# Patient Record
Sex: Male | Born: 1945 | Race: White | Hispanic: No | Marital: Married | Smoking: Never smoker
Health system: Southern US, Community
[De-identification: ages and names within clinical notes are randomized; demographics above are authoritative.]

## PROBLEM LIST (undated history)

## (undated) DIAGNOSIS — J449 Chronic obstructive pulmonary disease, unspecified: Secondary | ICD-10-CM

## (undated) DIAGNOSIS — I209 Angina pectoris, unspecified: Secondary | ICD-10-CM

## (undated) DIAGNOSIS — N400 Enlarged prostate without lower urinary tract symptoms: Secondary | ICD-10-CM

## (undated) DIAGNOSIS — K219 Gastro-esophageal reflux disease without esophagitis: Secondary | ICD-10-CM

## (undated) DIAGNOSIS — G4730 Sleep apnea, unspecified: Secondary | ICD-10-CM

## (undated) DIAGNOSIS — J45909 Unspecified asthma, uncomplicated: Secondary | ICD-10-CM

## (undated) DIAGNOSIS — I2510 Atherosclerotic heart disease of native coronary artery without angina pectoris: Secondary | ICD-10-CM

## (undated) DIAGNOSIS — E785 Hyperlipidemia, unspecified: Secondary | ICD-10-CM

## (undated) DIAGNOSIS — E119 Type 2 diabetes mellitus without complications: Secondary | ICD-10-CM

## (undated) DIAGNOSIS — I10 Essential (primary) hypertension: Secondary | ICD-10-CM

## (undated) HISTORY — PX: CORONARY ARTERY BYPASS GRAFT: SHX141

## (undated) HISTORY — PX: OTHER SURGICAL HISTORY: SHX169

## (undated) HISTORY — PX: CATARACT EXTRACTION W/ INTRAOCULAR LENS  IMPLANT, BILATERAL: SHX1307

## (undated) HISTORY — PX: HERNIA REPAIR: SHX51

## (undated) HISTORY — DX: Hyperlipidemia, unspecified: E78.5

## (undated) SURGERY — CARDIOVERSION (CATH LAB)
Anesthesia: IV Sedation (MBSC Only)

## (undated) SURGERY — LEFT HEART CATH
Anesthesia: Moderate Sedation | Laterality: Left

---

## 1980-05-26 HISTORY — PX: HERNIA REPAIR: SHX51

## 2003-10-19 ENCOUNTER — Other Ambulatory Visit: Payer: Self-pay

## 2004-08-26 ENCOUNTER — Ambulatory Visit: Payer: Self-pay | Admitting: Cardiovascular Disease

## 2004-10-01 ENCOUNTER — Encounter: Payer: Self-pay | Admitting: Cardiovascular Disease

## 2004-10-24 ENCOUNTER — Encounter: Payer: Self-pay | Admitting: Cardiovascular Disease

## 2004-11-23 ENCOUNTER — Encounter: Payer: Self-pay | Admitting: Cardiovascular Disease

## 2004-12-24 ENCOUNTER — Encounter: Payer: Self-pay | Admitting: Cardiovascular Disease

## 2005-01-24 ENCOUNTER — Encounter: Payer: Self-pay | Admitting: Cardiovascular Disease

## 2006-12-07 ENCOUNTER — Ambulatory Visit: Payer: Self-pay | Admitting: Endocrinology

## 2006-12-21 ENCOUNTER — Ambulatory Visit: Payer: Self-pay | Admitting: Endocrinology

## 2007-04-14 ENCOUNTER — Inpatient Hospital Stay: Payer: Self-pay | Admitting: Endocrinology

## 2007-04-14 ENCOUNTER — Other Ambulatory Visit: Payer: Self-pay

## 2008-05-08 ENCOUNTER — Encounter: Admission: RE | Admit: 2008-05-08 | Discharge: 2008-05-08 | Payer: Self-pay | Admitting: Sports Medicine

## 2008-09-12 ENCOUNTER — Ambulatory Visit: Payer: Self-pay | Admitting: Cardiovascular Disease

## 2009-01-18 ENCOUNTER — Encounter: Payer: Self-pay | Admitting: Cardiovascular Disease

## 2009-01-24 ENCOUNTER — Encounter: Payer: Self-pay | Admitting: Cardiovascular Disease

## 2009-02-08 ENCOUNTER — Emergency Department: Payer: Self-pay | Admitting: Emergency Medicine

## 2009-02-15 ENCOUNTER — Ambulatory Visit: Payer: Self-pay | Admitting: Internal Medicine

## 2009-02-23 ENCOUNTER — Encounter: Payer: Self-pay | Admitting: Cardiovascular Disease

## 2009-03-07 ENCOUNTER — Ambulatory Visit: Payer: Self-pay | Admitting: Gastroenterology

## 2009-03-26 ENCOUNTER — Encounter: Payer: Self-pay | Admitting: Cardiovascular Disease

## 2009-07-17 ENCOUNTER — Ambulatory Visit: Payer: Self-pay | Admitting: Internal Medicine

## 2009-07-19 DIAGNOSIS — E119 Type 2 diabetes mellitus without complications: Secondary | ICD-10-CM | POA: Insufficient documentation

## 2009-08-13 ENCOUNTER — Ambulatory Visit: Payer: Self-pay | Admitting: Internal Medicine

## 2009-10-16 ENCOUNTER — Ambulatory Visit: Payer: Self-pay | Admitting: Internal Medicine

## 2009-11-07 ENCOUNTER — Ambulatory Visit: Payer: Self-pay | Admitting: Gastroenterology

## 2010-02-18 ENCOUNTER — Ambulatory Visit: Payer: Self-pay | Admitting: Cardiovascular Disease

## 2010-06-25 ENCOUNTER — Encounter: Payer: Self-pay | Admitting: Rheumatology

## 2010-06-26 ENCOUNTER — Encounter: Payer: Self-pay | Admitting: Rheumatology

## 2010-07-18 ENCOUNTER — Ambulatory Visit: Payer: Self-pay | Admitting: Internal Medicine

## 2010-07-25 ENCOUNTER — Encounter: Payer: Self-pay | Admitting: Rheumatology

## 2010-10-30 DIAGNOSIS — I10 Essential (primary) hypertension: Secondary | ICD-10-CM | POA: Insufficient documentation

## 2010-10-30 DIAGNOSIS — I4892 Unspecified atrial flutter: Secondary | ICD-10-CM | POA: Insufficient documentation

## 2010-10-30 DIAGNOSIS — E785 Hyperlipidemia, unspecified: Secondary | ICD-10-CM | POA: Insufficient documentation

## 2011-03-28 ENCOUNTER — Ambulatory Visit: Payer: Self-pay | Admitting: Ophthalmology

## 2011-03-28 DIAGNOSIS — I119 Hypertensive heart disease without heart failure: Secondary | ICD-10-CM

## 2011-04-08 ENCOUNTER — Ambulatory Visit: Payer: Self-pay | Admitting: Ophthalmology

## 2011-04-14 ENCOUNTER — Ambulatory Visit: Payer: Self-pay | Admitting: Internal Medicine

## 2011-04-22 ENCOUNTER — Ambulatory Visit: Payer: Self-pay | Admitting: Ophthalmology

## 2011-07-08 ENCOUNTER — Ambulatory Visit: Payer: Self-pay

## 2011-08-13 ENCOUNTER — Inpatient Hospital Stay: Payer: Self-pay | Admitting: Internal Medicine

## 2011-08-13 LAB — CBC WITH DIFFERENTIAL/PLATELET
Basophil #: 0 10*3/uL (ref 0.0–0.1)
Basophil %: 0.4 %
Eosinophil #: 0.1 10*3/uL (ref 0.0–0.7)
HGB: 13.3 g/dL (ref 13.0–18.0)
Lymphocyte %: 31.6 %
MCH: 30.7 pg (ref 26.0–34.0)
MCHC: 34.1 g/dL (ref 32.0–36.0)
MCV: 90 fL (ref 80–100)
Monocyte #: 0.6 10*3/uL (ref 0.0–0.7)
Neutrophil %: 56.9 %
Platelet: 151 10*3/uL (ref 150–440)
RBC: 4.32 10*6/uL — ABNORMAL LOW (ref 4.40–5.90)
RDW: 15.3 % — ABNORMAL HIGH (ref 11.5–14.5)

## 2011-08-13 LAB — BASIC METABOLIC PANEL
BUN: 18 mg/dL (ref 7–18)
Calcium, Total: 8.5 mg/dL (ref 8.5–10.1)
Co2: 26 mmol/L (ref 21–32)
EGFR (African American): >60
Glucose: 175 mg/dL — ABNORMAL HIGH (ref 65–99)
Sodium: 137 mmol/L (ref 136–145)

## 2011-08-13 LAB — PROTIME-INR: INR: 1.8

## 2011-08-13 LAB — CK-MB: CK-MB: 6 ng/mL — ABNORMAL HIGH (ref 0.5–3.6)

## 2011-08-14 LAB — CBC WITH DIFFERENTIAL/PLATELET
Basophil #: 0 10*3/uL (ref 0.0–0.1)
Basophil %: 0.2 %
Eosinophil #: 0 10*3/uL (ref 0.0–0.7)
HGB: 13.5 g/dL (ref 13.0–18.0)
Lymphocyte %: 16.4 %
MCH: 30.1 pg (ref 26.0–34.0)
MCHC: 33.5 g/dL (ref 32.0–36.0)
Monocyte %: 2.2 %
Neutrophil #: 3.9 10*3/uL (ref 1.4–6.5)
Neutrophil %: 81.2 %

## 2011-08-14 LAB — BASIC METABOLIC PANEL
BUN: 19 mg/dL — ABNORMAL HIGH (ref 7–18)
Creatinine: 1.09 mg/dL (ref 0.60–1.30)
EGFR (African American): >60
EGFR (Non-African Amer.): >60
Glucose: 294 mg/dL — ABNORMAL HIGH (ref 65–99)
Potassium: 4.6 mmol/L (ref 3.5–5.1)
Sodium: 137 mmol/L (ref 136–145)

## 2011-08-14 LAB — PROTIME-INR
INR: 1.9
Prothrombin Time: 22 secs — ABNORMAL HIGH (ref 11.5–14.7)

## 2011-08-15 LAB — BASIC METABOLIC PANEL
Calcium, Total: 8.8 mg/dL (ref 8.5–10.1)
EGFR (African American): >60
EGFR (Non-African Amer.): >60
Glucose: 292 mg/dL — ABNORMAL HIGH (ref 65–99)
Sodium: 136 mmol/L (ref 136–145)

## 2011-08-15 LAB — CBC WITH DIFFERENTIAL/PLATELET
Basophil #: 0 10*3/uL (ref 0.0–0.1)
Eosinophil #: 0 10*3/uL (ref 0.0–0.7)
Lymphocyte %: 7.7 %
MCHC: 33.2 g/dL (ref 32.0–36.0)
Monocyte %: 3.7 %
Neutrophil #: 11.6 10*3/uL — ABNORMAL HIGH (ref 1.4–6.5)
Neutrophil %: 88.6 %
Platelet: 181 10*3/uL (ref 150–440)
RDW: 15.3 % — ABNORMAL HIGH (ref 11.5–14.5)
WBC: 13 10*3/uL — ABNORMAL HIGH (ref 3.8–10.6)

## 2011-08-16 LAB — BASIC METABOLIC PANEL
Anion Gap: 9 (ref 7–16)
BUN: 23 mg/dL — ABNORMAL HIGH (ref 7–18)
Chloride: 102 mmol/L (ref 98–107)
Creatinine: 0.97 mg/dL (ref 0.60–1.30)
Potassium: 4.6 mmol/L (ref 3.5–5.1)
Sodium: 137 mmol/L (ref 136–145)

## 2011-08-16 LAB — CBC WITH DIFFERENTIAL/PLATELET
Basophil #: 0 10*3/uL (ref 0.0–0.1)
Lymphocyte #: 1.3 10*3/uL (ref 1.0–3.6)
MCV: 91 fL (ref 80–100)
Monocyte %: 2.8 %
Neutrophil #: 12 10*3/uL — ABNORMAL HIGH (ref 1.4–6.5)
RDW: 15.9 % — ABNORMAL HIGH (ref 11.5–14.5)
WBC: 13.7 10*3/uL — ABNORMAL HIGH (ref 3.8–10.6)

## 2011-08-16 LAB — PROTIME-INR: INR: 2.4

## 2011-08-17 LAB — BASIC METABOLIC PANEL
Calcium, Total: 8.5 mg/dL (ref 8.5–10.1)
Chloride: 101 mmol/L (ref 98–107)
Osmolality: 290 (ref 275–301)
Potassium: 4.7 mmol/L (ref 3.5–5.1)
Sodium: 137 mmol/L (ref 136–145)

## 2011-08-17 LAB — CBC WITH DIFFERENTIAL/PLATELET
Basophil #: 0 10*3/uL (ref 0.0–0.1)
Basophil %: 0.1 %
Eosinophil %: 0 %
HCT: 41.7 % (ref 40.0–52.0)
Lymphocyte #: 1.5 10*3/uL (ref 1.0–3.6)
Lymphocyte %: 11.9 %
MCV: 91 fL (ref 80–100)
Monocyte %: 4.3 %
Neutrophil #: 10.8 10*3/uL — ABNORMAL HIGH (ref 1.4–6.5)
RBC: 4.61 10*6/uL (ref 4.40–5.90)
WBC: 12.9 10*3/uL — ABNORMAL HIGH (ref 3.8–10.6)

## 2011-08-17 LAB — PROTIME-INR: Prothrombin Time: 28.2 secs — ABNORMAL HIGH (ref 11.5–14.7)

## 2011-08-18 LAB — PROTIME-INR: Prothrombin Time: 28.9 secs — ABNORMAL HIGH (ref 11.5–14.7)

## 2011-08-19 LAB — PROTIME-INR: INR: 3

## 2012-11-09 DIAGNOSIS — I279 Pulmonary heart disease, unspecified: Secondary | ICD-10-CM | POA: Insufficient documentation

## 2013-06-16 DIAGNOSIS — M5416 Radiculopathy, lumbar region: Secondary | ICD-10-CM | POA: Insufficient documentation

## 2013-06-16 DIAGNOSIS — E291 Testicular hypofunction: Secondary | ICD-10-CM | POA: Insufficient documentation

## 2013-07-14 ENCOUNTER — Ambulatory Visit: Payer: Self-pay | Admitting: Internal Medicine

## 2014-08-08 ENCOUNTER — Ambulatory Visit: Payer: Self-pay | Admitting: Internal Medicine

## 2014-09-17 NOTE — H&P (Signed)
PATIENT NAMELUNDON, [name] MR#:  [id number] DATE OF BIRTH:  October 28, 1945  DATE OF ADMISSION:  08/13/2011  PRIMARY CARE PHYSICIAN: Bluford Main, MD   PULMONOLOGIST: Yevonne Pax, MD   CHIEF COMPLAINT: Shortness of breath, cough and congestion ongoing for the past few weeks.   HISTORY OF PRESENT ILLNESS: The patient is a 69 year old male who presents from his pulmonologist's office due to progressive shortness of breath and cough having failed outpatient therapy for chronic obstructive pulmonary disease. As per the patient, about four weeks ago the patient got a viral illness. He developed postnasal drip, some cough, and some congestion. The patient was given a Medrol Dosepak and also some antibiotics and some Tussionex, and usually that clears his symptoms up. It helped alleviate his symptoms somewhat, but they recurred. He continues to have persistent symptoms over the past few weeks and has received two rounds of prednisone and antibiotics with not much improvement. He was referred to the hospital for inpatient treatment of chronic obstructive pulmonary disease exacerbation.   REVIEW OF SYSTEMS: CONSTITUTIONAL: No documented fever. No weight gain, no weight loss. HEENT: No blurry or double vision. ENT: No tinnitus. Positive postnasal drip. No redness of the oropharynx. RESPIRATORY: Positive cough. Positive wheeze. No hemoptysis. Positive dyspnea on exertion. CARDIOVASCULAR: Positive chest pain only with a cough. No orthopnea, no palpitations or syncope. GASTROINTESTINAL: No nausea, no vomiting, no diarrhea, no abdominal pain, no melena, no hematochezia. GU: No dysuria, no hematuria. ENDOCRINE: No polyuria or nocturia. No heat or cold intolerance. HEMATOLOGIC: No anemia, no bruising, no bleeding. INTEGUMENTARY: No rashes. No lesions. MUSCULOSKELETAL: No arthritis, no swelling, no gout. NEUROLOGIC: No numbness or tingling. No ataxia. No seizure-type activity. PSYCHIATRIC: No anxiety, no insomnia, no  ADD.   PAST MEDICAL HISTORY:  1. Chronic obstructive pulmonary disease.  2. Obstructive sleep apnea. 3. Hypertension. 4. Coronary disease, status post coronary artery bypass graft. 5. Chronic atrial fibrillation.  6. Hyperlipidemia. 7. Diabetes. 8. Diabetic neuropathy.   ALLERGIES: Tetanus toxoid.   SOCIAL HISTORY: No smoking. No alcohol abuse. No illicit drug abuse. He lives at home with his wife, is married.   FAMILY HISTORY: Positive for diabetes and coronary disease.   CURRENT MEDICATIONS:  1. Aspirin 81 mg daily.  2. Albuterol inhaler 2 puffs q.i.d. as needed.  3. Zyrtec 10 mg daily.  4. Metoprolol succinate 50 mg daily.  5. Omeprazole 40 mg daily.  6. Spiriva 1 puff daily.  7. Dulera 2 puffs b.i.d.  8. Nasonex one spray to each nostril daily.  9. Lantus 40 units b.i.d.  10. Lyrica 75 mg t.i.d.  11. Singulair 10 mg daily.  12. Lovaza 1 gram b.i.d.  13. Metformin 1000 mg b.i.d.  14. Amiodarone 200 mg daily.  15. Sinemet with chromium 1000 mg b.i.d.  16. Coenzyme Q daily.  17. Coumadin 4 mg daily.  18. L-carnitine 100 mg daily. 19. Valproic acid 25 mg daily.  20. Losartan 50 mg daily.  21. Vitamin D3, 5000 international units 1 tab daily.  22. Welchol 625 mg b.i.d.     PHYSICAL EXAMINATION:  VITAL SIGNS: Presently, temperature is 98.2, pulse 83, respirations 20, blood pressure 120/79, saturations 94% on room air.   GENERAL: He is a pleasant-appearing male in no apparent distress.   HEENT: Atraumatic, normocephalic. Extraocular muscles are intact. Pupils are equal and reactive to light. Sclerae are anicteric. No conjunctival injection. No oropharyngeal erythema.   NECK: Supple. No jugular venous distention. No bruits, no lymphadenopathy or thyromegaly.  HEART: Regular rate and rhythm. No murmurs, no rubs, no clicks.   LUNGS: He has some coarse rhonchi and wheezing at the bases, otherwise good air entry. Negative use of accessory muscles. No dullness to  percussion.   ABDOMEN: Soft, flat, nontender, nondistended, has good bowel sounds. No hepatosplenomegaly appreciated.   EXTREMITIES: No evidence of any cyanosis, clubbing, or peripheral edema. Has +2 pedal and radial pulses bilaterally.   NEUROLOGIC: The patient is alert, awake, and oriented x3 with no focal motor or sensory deficits appreciated bilaterally.   SKIN: Moist and warm with no rashes appreciated.   LYMPHATIC: There is no cervical or axillary lymphadenopathy.   LABORATORY, DIAGNOSTIC AND RADIOLOGICAL DATA: Currently pending.   ASSESSMENT AND PLAN: The patient is a 69 year old male with past medical history of coronary disease, status post coronary artery bypass graft, hypertension, hyperlipidemia, history of chronic obstructive pulmonary disease, chronic atrial fibrillation, gastroesophageal reflux disease, obstructive sleep apnea, presents to the hospital with a 4 to 5-week history of cough, congestion and shortness of breath, and having failed outpatient therapy for chronic obstructive pulmonary disease exacerbation.   1. Chronic obstructive pulmonary disease exacerbation: As mentioned, the patient has failed outpatient therapy with two doses of Medrol dose packs and antibiotics including Augmentin and Levaquin. I will therefore start him on IV steroids presently, also start him on IV ceftriaxone and Zithromax. I will obtain sputum cultures as he has had a nonproductive cough; therefore, I will place him on nebulizer treatments with Mucomyst, give him a flutter valve. Continue his Spiriva and add some Advair. We will consider getting a CT of his chest but will need a BUN and creatinine prior to doing that. We will get a Pulmonary consult. The patient is normally followed by Dr. Freda MunroSaadat Khan, who I will go ahead and consult.  2. Hypertension: The patient presently is hemodynamically stable. Continue with metoprolol and losartan.  3. Diabetes: I will continue his Lantus and sliding scale  coverage for now. I will place him on a carb-controlled diet. Hold his metformin in case we need to do a CT of the chest with contrast.  4. Chronic atrial fibrillation, currently rate controlled: Continue metoprolol. Await INR prior to resuming his Coumadin.  5. Diabetic neuropathy: Continue with Lyrica.  6. Gastroesophageal reflux disease: Continue with Prilosec.   CODE STATUS: The patient is a FULL CODE.    TIME SPENT ON ADMISSION: 50 minutes.   ____________________________ Rolly PancakeVivek J. Cherlynn KaiserSainani, MD vjs:cbb D: 08/13/2011 17:39:51 ET T: 08/13/2011 18:23:25 ET JOB#: [id number]  cc: Rolly PancakeVivek J. Cherlynn KaiserSainani, MD, <Dictator> Sheikh A. Ellsworth Lennoxejan-Sie, MD [place] SirenVIVEK J Alcie Runions MD ELECTRONICALLY SIGNED 08/13/2011 19:44

## 2014-09-17 NOTE — Discharge Summary (Signed)
PATIENT NAMNelson Werner:  Brigante, [name] MR#:  [id number] DATE OF BIRTH:  01-Oct-1945  DATE OF ADMISSION:  08/13/2011 DATE OF DISCHARGE:  08/19/2011  DISCHARGE DIAGNOSES:  1. Chronic obstructive pulmonary disease exacerbation.  2. Diabetes mellitus.  3. Chronic atrial fibrillation.   DISCHARGE MEDICATIONS:  1. Augmentin 500 mg twice a day.  2. Prednisone taper starting at 60 mg, decrease by 5 mg daily.  The patient is to resume other home medications. Please see discharge medical reconciliation.   PROCEDURES: Chest x-ray, CT scan.   CONSULTATIONS: Pulmonology, Dr. Freda MunroSaadat Khan.      HOSPITAL COURSE: This 69 year old male with history of chronic obstructive pulmonary disease was admitted through the Emergency Room with the clinical features of exacerbation of chronic obstructive pulmonary disease. Please see History and Physical for details. The patient was severely bronchospastic and was admitted to regular floor where he received nebulized bronchodilators and high-dose intravenous steroids and intravenous antibiotics. The patient'Brysten Reister condition gradually improved.  He was evaluated by the pulmonologist Dr. Freda MunroSaadat Khan who recommended continuation of his current steroids, antibiotics, and bronchodilator treatment. The patient made a slow but gradual recovery without any major complications. The patient did experience hyperglycemia which was attributed to his steroids. His basal insulin was adjusted and sliding scale insulin coverage was also added. The patient'Capone Schwinn stay was otherwise uncomplicated. The patient will be discharged to home in stable condition.   DIET: ADA diet, low sodium.   ACTIVITY: As tolerated.   FOLLOWUP:  1. Follow up with Dr. Ellsworth Lennoxejan-Sie in 1 to 2 weeks. 2. Follow up with Dr. Freda MunroSaadat Khan in 1 to 2 weeks.     DISCHARGE CONDITION: Satisfactory.   DISCHARGE PROCESS TIME SPENT: 35 minutes.    ____________________________ Silas FloodSheikh A. Ellsworth Lennoxejan-Sie, MD sat:bjt D: 08/19/2011 13:36:37  ET T: 08/20/2011 10:41:14 ET JOB#: [id number]  cc: Sheikh A. Ellsworth Lennoxejan-Sie, MD, <Dictator> Charlesetta GaribaldiSHEIKH A TEJAN-SIE MD ELECTRONICALLY SIGNED 08/21/2011 13:35

## 2014-09-17 NOTE — Consult Note (Signed)
Chief Complaint:   Subjective/Chief Complaint patient is doing better. There is no PE noted on the Ct scan. Less SOB. gallstones are noted however   VITAL SIGNS/ANCILLARY NOTES: **Vital Signs.:   23-Mar-13 04:25   Vital Signs Type Routine   Temperature Temperature (F) 97.7   Celsius 36.5   Temperature Source axillary   Pulse Pulse 60   Pulse source per Dinamap   Respirations Respirations 22   Systolic BP Systolic BP 137   Diastolic BP (mmHg) Diastolic BP (mmHg) 65   Mean BP 89   BP Source Dinamap   Pulse Ox % Pulse Ox % 95   Pulse Ox Activity Level  At rest   Oxygen Delivery c-pap  *Intake and Output.:   Daily 23-Mar-13 07:00   Grand Totals Intake:  720 Output:  500    Net:  220 24 Hr.:  220   Oral Intake      In:  720   Urine ml     Out:  500   Length of Stay Totals Intake:  1833 Output:  2400    Net:  -567   Brief Assessment:   Cardiac Regular  -- LE edema  -- JVD  --Gallop    Respiratory normal resp effort  clear BS  no use of accessory muscles    Gastrointestinal details normal Soft  Nontender  No rigidity   Radiology Results: CT:    23-Mar-13 09:29, CT Chest for Pulm Embolism With Contrast   CT Chest for Pulm Embolism With Contrast    REASON FOR EXAM:    persistant SOB with tightness pain in chest  COMMENTS:       PROCEDURE: CT  - CT CHEST (FOR PE) W  - Aug 16 2011  9:29AM     RESULT: Axial CT scanning was performed through the chest with   reconstructions at 3 mm intervals and slice thicknesses following   intravenous administration of 75 cc of Isovue 370. Review of multiplanar   reconstructed images was performed separately on the VIA monitor.    Contrast within the pulmonary arterial tree is normal. I see no filling   defects to suggest an acute pulmonary embolism. The caliber of the   thoracic aorta is normal. I see no false lumen. The thoracic esophagus is   normal in caliber. I see no pathologic sized mediastinal or hilar lymph   nodes. The  cardiac chambers are normal in size. The patient has undergone     previous CABG. I see no pleural effusion. At lung window settings I see   no interstitial nor alveolar infiltrates. Minimal atelectasis in the   posterior costophrenic gutters is present. I see no suspicious pulmonary   parenchymal nodules.    Within the upper abdomen there are tiny layering gallstones in the   moderately distended gallbladder. The liver is normal in appearance where   visualized. The spleen is not enlarged. There are no adrenal masses. The  observed portions of the pancreas are normal in appearance.    IMPRESSION:   1. I do not see evidence of an acute pulmonary embolism nor acute   thoracic aortic pathology.  2. There is no evidence of CHF. The patient has undergone previous CABG.  3. I do not see evidence of pneumonia. There may be minimal basilar     atelectasis especially on the right posteriorly.  4. There are gallstones present. The gallbladder is only partially   included in the field-of-view and it appears  moderately distended.          Verified By: DAVID A. [place], M.D., MD   Assessment/Plan:  Assessment/Plan:   Assessment 1. Asthmatic Bronchitis -clinically is improving -will cotinue with steroids abx -hopefully discharge home soon   Electronic Signatures: Yevonne Pax (MD)  (Signed 23-Mar-13 13:35)  Authored: Chief Complaint, VITAL SIGNS/ANCILLARY NOTES, Brief Assessment, Radiology Results, Assessment/Plan   Last Updated: 23-Mar-13 13:35 by Yevonne Pax (MD)

## 2014-10-06 ENCOUNTER — Encounter: Payer: Self-pay | Admitting: Emergency Medicine

## 2014-10-06 ENCOUNTER — Observation Stay
Admission: EM | Admit: 2014-10-06 | Discharge: 2014-10-10 | Disposition: A | Discharge status: Home / Self Care | Payer: Medicare Other | Attending: Internal Medicine | Admitting: Internal Medicine

## 2014-10-06 DIAGNOSIS — R0602 Shortness of breath: Secondary | ICD-10-CM | POA: Diagnosis not present

## 2014-10-06 DIAGNOSIS — Z794 Long term (current) use of insulin: Secondary | ICD-10-CM | POA: Insufficient documentation

## 2014-10-06 DIAGNOSIS — J449 Chronic obstructive pulmonary disease, unspecified: Secondary | ICD-10-CM | POA: Diagnosis not present

## 2014-10-06 DIAGNOSIS — Z888 Allergy status to other drugs, medicaments and biological substances status: Secondary | ICD-10-CM | POA: Insufficient documentation

## 2014-10-06 DIAGNOSIS — Z9889 Other specified postprocedural states: Secondary | ICD-10-CM | POA: Insufficient documentation

## 2014-10-06 DIAGNOSIS — Z887 Allergy status to serum and vaccine status: Secondary | ICD-10-CM | POA: Diagnosis not present

## 2014-10-06 DIAGNOSIS — K219 Gastro-esophageal reflux disease without esophagitis: Secondary | ICD-10-CM | POA: Insufficient documentation

## 2014-10-06 DIAGNOSIS — I2510 Atherosclerotic heart disease of native coronary artery without angina pectoris: Secondary | ICD-10-CM

## 2014-10-06 DIAGNOSIS — J45909 Unspecified asthma, uncomplicated: Secondary | ICD-10-CM | POA: Diagnosis not present

## 2014-10-06 DIAGNOSIS — R079 Chest pain, unspecified: Secondary | ICD-10-CM | POA: Diagnosis not present

## 2014-10-06 DIAGNOSIS — G4730 Sleep apnea, unspecified: Secondary | ICD-10-CM | POA: Insufficient documentation

## 2014-10-06 DIAGNOSIS — Z951 Presence of aortocoronary bypass graft: Secondary | ICD-10-CM | POA: Diagnosis not present

## 2014-10-06 DIAGNOSIS — E1165 Type 2 diabetes mellitus with hyperglycemia: Secondary | ICD-10-CM | POA: Diagnosis not present

## 2014-10-06 DIAGNOSIS — I25110 Atherosclerotic heart disease of native coronary artery with unstable angina pectoris: Secondary | ICD-10-CM | POA: Diagnosis not present

## 2014-10-06 DIAGNOSIS — I10 Essential (primary) hypertension: Secondary | ICD-10-CM | POA: Diagnosis present

## 2014-10-06 DIAGNOSIS — R739 Hyperglycemia, unspecified: Secondary | ICD-10-CM

## 2014-10-06 DIAGNOSIS — I4891 Unspecified atrial fibrillation: Secondary | ICD-10-CM

## 2014-10-06 DIAGNOSIS — Z7901 Long term (current) use of anticoagulants: Secondary | ICD-10-CM | POA: Insufficient documentation

## 2014-10-06 DIAGNOSIS — E119 Type 2 diabetes mellitus without complications: Secondary | ICD-10-CM

## 2014-10-06 DIAGNOSIS — I482 Chronic atrial fibrillation: Principal | ICD-10-CM | POA: Insufficient documentation

## 2014-10-06 DIAGNOSIS — E785 Hyperlipidemia, unspecified: Secondary | ICD-10-CM | POA: Insufficient documentation

## 2014-10-06 DIAGNOSIS — I4892 Unspecified atrial flutter: Secondary | ICD-10-CM | POA: Insufficient documentation

## 2014-10-06 DIAGNOSIS — I209 Angina pectoris, unspecified: Secondary | ICD-10-CM

## 2014-10-06 HISTORY — DX: Angina pectoris, unspecified: I20.9

## 2014-10-06 HISTORY — DX: Sleep apnea, unspecified: G47.30

## 2014-10-06 HISTORY — DX: Type 2 diabetes mellitus without complications: E11.9

## 2014-10-06 HISTORY — DX: Unspecified asthma, uncomplicated: J45.909

## 2014-10-06 HISTORY — DX: Atherosclerotic heart disease of native coronary artery without angina pectoris: I25.10

## 2014-10-06 HISTORY — DX: Chronic obstructive pulmonary disease, unspecified: J44.9

## 2014-10-06 HISTORY — DX: Gastro-esophageal reflux disease without esophagitis: K21.9

## 2014-10-06 HISTORY — DX: Essential (primary) hypertension: I10

## 2014-10-06 LAB — CBC
HCT: 39.1 % — ABNORMAL LOW (ref 40.0–52.0)
Hemoglobin: 13.5 g/dL (ref 13.0–18.0)
MCH: 30.1 pg (ref 26.0–34.0)
MCHC: 34.5 g/dL (ref 32.0–36.0)
MCV: 87.4 fL (ref 80.0–100.0)
Platelets: 210 10*3/uL (ref 150–440)
RBC: 4.48 MIL/uL (ref 4.40–5.90)
RDW: 14.8 % — AB (ref 11.5–14.5)
WBC: 9.4 10*3/uL (ref 3.8–10.6)

## 2014-10-06 MED ORDER — ONDANSETRON HCL 4 MG/2ML IJ SOLN
4.0000 mg | Freq: Once | INTRAMUSCULAR | Status: AC
  Administered 2014-10-07: 4 mg via INTRAVENOUS

## 2014-10-06 MED ORDER — SODIUM CHLORIDE 0.9 % IV BOLUS (SEPSIS)
500.0000 mL | Freq: Once | INTRAVENOUS | Status: AC
  Administered 2014-10-07: 500 mL via INTRAVENOUS

## 2014-10-06 MED ORDER — MORPHINE SULFATE 2 MG/ML IJ SOLN
2.0000 mg | Freq: Once | INTRAMUSCULAR | Status: AC
  Administered 2014-10-07: 2 mg via INTRAVENOUS

## 2014-10-06 NOTE — ED Notes (Signed)
Patient with complaint of left chest pain that started about 30 minutes prior to arrival. Patient with some shortness of breath, denies nausea. Patient states he had an abnormal stress test yesterday.

## 2014-10-06 NOTE — ED Provider Notes (Signed)
[facility] Emergency Department Provider Note  ____________________________________________  Time seen: Approximately 11:58 PM  I have reviewed the triage vital signs and the nursing notes.   HISTORY  Chief Complaint Chest Pain   HPI [name] is a 69 y.o. male who presents with chest pain and left arm pain onset approximately 1 hour prior to arrival while laying down for bed. Patient describes left arm feeling heavy, then 6/10 tightness in the left chest, and sharp/dull pain to left shoulder blade.States pain associated with palpitations. Denies nausea, vomiting, sweating, shortness of breath. Describes pain as constant, not worsened by deep breaths, and partially relieved by aspirin taken prior to arrival.  Patient states he had Myoview stress test by Dr. Park Breed 2 days ago. Was notified yesterday that his stress test was "abnormal". Cardiologist took patient off of his metoprolol which he had been taking for years and changed to losartan-HCTZ 3 days ago. Patient had been taken losartan for years as well; his cardiologist felt he did not need the beta blocker and instead required the diuretic.  Patient denies recent travel, surgery and denies taking hormones.    Past Medical History  Diagnosis Date  . Asthma   . COPD (chronic obstructive pulmonary disease)     Early stages  . Diabetes mellitus without complication   . Hypertension   . GERD (gastroesophageal reflux disease)   . Sleep apnea   . Coronary artery disease   . Anginal pain    Diabetes Hypertension COPD Patient Active Problem List   Diagnosis Date Noted  . Chest pain 10/07/2014  . HTN (hypertension) 10/07/2014  . DM (diabetes mellitus) 10/07/2014  . COPD (chronic obstructive pulmonary disease) 10/07/2014  . Coronary artery disease 10/07/2014    Past Surgical History  Procedure Laterality Date  . Quadruple bypass    . Hernia repair    . Coronary artery bypass graft     CABG No  current outpatient prescriptions on file.  Allergies Ace inhibitors; Nitrates, organic; and Tetanus toxoids  Family History  Problem Relation Age of Onset  . Diabetes Mother   . Hypertension Mother   . COPD Father    FH of diabetes  Social History History  Substance Use Topics  . Smoking status: Never Smoker   . Smokeless tobacco: Not on file  . Alcohol Use: No   Nonsmoker  Review of Systems Constitutional: No fever/chills Eyes: No visual changes. ENT: No sore throat. Cardiovascular: Positive for chest pain. Respiratory: Denies shortness of breath. Mild nonproductive cough x several weeks. Gastrointestinal: No abdominal pain.  No nausea, no vomiting.  No diarrhea.  No constipation. Genitourinary: Negative for dysuria. Musculoskeletal: Negative for back pain. Skin: Negative for rash. Neurological: Negative for headaches, focal weakness or numbness.  10-point ROS otherwise negative.  ____________________________________________   PHYSICAL EXAM:  VITAL SIGNS: ED Triage Vitals  Enc Vitals Group     BP 10/06/14 2325 135/82 mmHg     Pulse Rate 10/06/14 2325 142     Resp 10/06/14 2325 18     Temp 10/06/14 2325 97.5 F (36.4 C)     Temp Source 10/06/14 2325 Oral     SpO2 10/06/14 2325 92 %     Weight 10/06/14 2325 225 lb (102.059 kg)     Height 10/06/14 2325  (1.803 m)     Head Cir --      Peak Flow --      Pain Score 10/06/14 2326 7  Pain Loc --      Pain Edu? --      Excl. in GC? --     Constitutional: Alert and oriented. Well appearing and in mild acute distress. Appears slightly anxious. Eyes: Conjunctivae are normal. PERRL. EOMI. Head: Atraumatic. Nose: No congestion/rhinnorhea. Mouth/Throat: Mucous membranes are moist.  Oropharynx non-erythematous. Neck: No stridor.   Cardiovascular: Tachycardic, regular rhythm. Grossly normal heart sounds.  Good peripheral circulation. Respiratory: Normal respiratory effort.  No retractions. Lungs slightly  diminished but CTAB. Gastrointestinal: Soft and nontender. No distention. No abdominal bruits. No CVA tenderness. Musculoskeletal: No lower extremity tenderness nor edema.  No joint effusions. Neurologic:  Normal speech and language. No gross focal neurologic deficits are appreciated. Speech is normal. No gait instability. Skin:  Skin is warm, dry and intact. No rash noted. Psychiatric: Mood and affect are normal. Speech and behavior are normal.  ____________________________________________   LABS (all labs ordered are listed, but only abnormal results are displayed)  Labs Reviewed  CBC - Abnormal; Notable for the following:    HCT 39.1 (*)    RDW 14.8 (*)    All other components within normal limits  COMPREHENSIVE METABOLIC PANEL - Abnormal; Notable for the following:    Sodium 132 (*)    Chloride 99 (*)    Glucose, Bld 411 (*)    BUN 21 (*)    AST 77 (*)    ALT 121 (*)    All other components within normal limits  TROPONIN I - Abnormal; Notable for the following:    Troponin I 0.60 (*)    All other components within normal limits  COMPREHENSIVE METABOLIC PANEL - Abnormal; Notable for the following:    Sodium 134 (*)    Chloride 100 (*)    Glucose, Bld 334 (*)    BUN 21 (*)    Calcium 8.4 (*)    Total Protein 6.2 (*)    Albumin 3.4 (*)    AST 59 (*)    ALT 106 (*)    All other components within normal limits  TROPONIN I  PROTIME-INR  BRAIN NATRIURETIC PEPTIDE  TROPONIN I  TROPONIN I   ____________________________________________  EKG  ED ECG REPORT   Date: 10/07/2014  EKG Time: 2324  Rate: 142  Rhythm: sinus tachycardia  Axis: LAD  Intervals:none  ST&T Change: Nonspecific  ____________________________________________  RADIOLOGY  Portable chest x-ray (reviewed by me, interpreted by Dr. Cherly Hensenhang):  Lungs mildly hypoexpanded. Vascular congestion and mild cardiomegaly. Mild left basilar airspace opacity may reflect mild pneumonia or possibly asymmetric  interstitial edema. ____________________________________________   PROCEDURES  Procedure(s) performed: None  Critical Care performed: No  ____________________________________________   INITIAL IMPRESSION / ASSESSMENT AND PLAN / ED COURSE  Pertinent labs & imaging results that were available during my care of the patient were reviewed by me and considered in my medical decision making (see chart for details).  69 year old male who presents with chest pain and palpitations. History of coronary artery disease status post CABG. EKG reveals sinus tachycardia at a rate of 142; this is likely due to recent discontinuation of beta blocker. However, consider PE and aortic dissection in differential diagnosis; although dissection less likely given normal symmetrical blood pressures in both arms. Patient took aspirin prior to arrival and is allergic to nitroglycerin. Will administer IV morphine along with anti-emetic. Consider low-dose Lopressor after discussion with Dr. Welton FlakesKhan. I have already discussed with patient and spouse that he will require admission to the hospital.  -----------------------------------------  2:29 AM on 10/07/2014 -----------------------------------------  Prior to 1 AM, patient's heart rate slowed into normal rate. Repeat EKG reveals atrial flutter heart rate 75. Patient has a history of atrial fibrillation, formerly on Coumadin, but now on sotalol. After multiple attempts to reach his cardiologist, I did speak with Dr. Welton FlakesKhan who agrees with admission for ongoing chest pain after abnormal stress test. Initial troponin negative. Will discuss with hospitalist for admission. ____________________________________________   FINAL CLINICAL IMPRESSION(S) / ED DIAGNOSES  Final diagnoses:  Chest pain, unspecified chest pain type  Atrial fibrillation and flutter  Hyperglycemia      Irean HongJade J Khiree Bukhari, MD 10/07/14 [phone]

## 2014-10-06 NOTE — ED Notes (Addendum)
Pt to ED from c/o chest pain starting about 30 minutes PTA while lying down for bed.  Pt states tightness to left chest radiating to left shoulder and arm.  Pt has hx of diabetes, asthma, hx of quadruple bypass surgery, denies prior MI.  Pt had stress test yesterday.  Doctor said "there is slight abnormality".  Pt states took 81mg  ASA and then full strength powdered ASA around 2300 tonight with some relief.  Pt lungs clear and diminished bilaterally, A&Ox4, and speaking in complete and coherent sentences.  Pt changed from metoprolol-HCTZ to losartan-HCTZ 3 days ago.

## 2014-10-07 ENCOUNTER — Encounter: Payer: Self-pay | Admitting: Internal Medicine

## 2014-10-07 ENCOUNTER — Emergency Department: Payer: Medicare Other

## 2014-10-07 DIAGNOSIS — E119 Type 2 diabetes mellitus without complications: Secondary | ICD-10-CM

## 2014-10-07 DIAGNOSIS — I10 Essential (primary) hypertension: Secondary | ICD-10-CM | POA: Diagnosis present

## 2014-10-07 DIAGNOSIS — J449 Chronic obstructive pulmonary disease, unspecified: Secondary | ICD-10-CM

## 2014-10-07 DIAGNOSIS — I482 Chronic atrial fibrillation: Secondary | ICD-10-CM | POA: Diagnosis not present

## 2014-10-07 DIAGNOSIS — I2510 Atherosclerotic heart disease of native coronary artery without angina pectoris: Secondary | ICD-10-CM

## 2014-10-07 DIAGNOSIS — R079 Chest pain, unspecified: Secondary | ICD-10-CM | POA: Diagnosis present

## 2014-10-07 LAB — GLUCOSE, CAPILLARY
GLUCOSE-CAPILLARY: 257 mg/dL — AB (ref 65–99)
GLUCOSE-CAPILLARY: 137 mg/dL — AB (ref 65–99)
Glucose-Capillary: 306 mg/dL — ABNORMAL HIGH (ref 65–99)
Glucose-Capillary: 168 mg/dL — ABNORMAL HIGH (ref 65–99)

## 2014-10-07 LAB — COMPREHENSIVE METABOLIC PANEL
ALBUMIN: 3.4 g/dL — AB (ref 3.5–5.0)
ALT: 121 U/L — ABNORMAL HIGH (ref 17–63)
ALT: 106 U/L — ABNORMAL HIGH (ref 17–63)
ANION GAP: 6 (ref 5–15)
AST: 77 U/L — ABNORMAL HIGH (ref 15–41)
AST: 59 U/L — ABNORMAL HIGH (ref 15–41)
Albumin: 3.7 g/dL (ref 3.5–5.0)
Alkaline Phosphatase: 48 U/L (ref 38–126)
Alkaline Phosphatase: 43 U/L (ref 38–126)
Anion gap: 11 (ref 5–15)
BILIRUBIN TOTAL: 0.4 mg/dL (ref 0.3–1.2)
BUN: 21 mg/dL — AB (ref 6–20)
BUN: 21 mg/dL — ABNORMAL HIGH (ref 6–20)
CALCIUM: 8.4 mg/dL — AB (ref 8.9–10.3)
CALCIUM: 8.9 mg/dL (ref 8.9–10.3)
CHLORIDE: 100 mmol/L — AB (ref 101–111)
CO2: 22 mmol/L (ref 22–32)
CO2: 28 mmol/L (ref 22–32)
CREATININE: 1.13 mg/dL (ref 0.61–1.24)
Chloride: 99 mmol/L — ABNORMAL LOW (ref 101–111)
Creatinine, Ser: 1.11 mg/dL (ref 0.61–1.24)
GFR calc Af Amer: >60 mL/min (ref >60)
GFR calc non Af Amer: >60 mL/min (ref >60)
GFR calc non Af Amer: >60 mL/min (ref >60)
Glucose, Bld: 411 mg/dL — ABNORMAL HIGH (ref 65–99)
Glucose, Bld: 334 mg/dL — ABNORMAL HIGH (ref 65–99)
POTASSIUM: 4.4 mmol/L (ref 3.5–5.1)
Potassium: 4.3 mmol/L (ref 3.5–5.1)
SODIUM: 134 mmol/L — AB (ref 135–145)
Sodium: 132 mmol/L — ABNORMAL LOW (ref 135–145)
Total Bilirubin: 0.4 mg/dL (ref 0.3–1.2)
Total Protein: 6.5 g/dL (ref 6.5–8.1)
Total Protein: 6.2 g/dL — ABNORMAL LOW (ref 6.5–8.1)

## 2014-10-07 LAB — TROPONIN I
TROPONIN I: 0.77 ng/mL — AB (ref <0.031)
Troponin I: 0.6 ng/mL — ABNORMAL HIGH (ref <0.031)
Troponin I: 0.87 ng/mL — ABNORMAL HIGH (ref <0.031)

## 2014-10-07 LAB — BRAIN NATRIURETIC PEPTIDE: B Natriuretic Peptide: 36 pg/mL (ref 0.0–100.0)

## 2014-10-07 LAB — PROTIME-INR
INR: 1.12
PROTHROMBIN TIME: 14.6 s (ref 11.4–15.0)

## 2014-10-07 MED ORDER — SODIUM CHLORIDE 0.9 % IJ SOLN
3.0000 mL | Freq: Two times a day (BID) | INTRAMUSCULAR | Status: DC
  Administered 2014-10-07 – 2014-10-08 (×3): 3 mL via INTRAVENOUS

## 2014-10-07 MED ORDER — HYDROCHLOROTHIAZIDE 12.5 MG PO CAPS
12.5000 mg | ORAL_CAPSULE | Freq: Every day | ORAL | Status: DC
  Administered 2014-10-07 – 2014-10-08 (×2): 12.5 mg via ORAL
  Filled 2014-10-07 (×2): qty 1

## 2014-10-07 MED ORDER — ADULT MULTIVITAMIN W/MINERALS CH
1.0000 | ORAL_TABLET | Freq: Every day | ORAL | Status: DC
  Administered 2014-10-07 – 2014-10-10 (×4): 1 via ORAL
  Filled 2014-10-07 (×4): qty 1

## 2014-10-07 MED ORDER — SODIUM CHLORIDE 0.9 % IJ SOLN: 3.0000 mL | INTRAMUSCULAR | Status: DC | PRN

## 2014-10-07 MED ORDER — LOSARTAN POTASSIUM-HCTZ 50-12.5 MG PO TABS: 1.0000 | ORAL_TABLET | Freq: Every day | ORAL | Status: DC

## 2014-10-07 MED ORDER — LINAGLIPTIN 5 MG PO TABS
5.0000 mg | ORAL_TABLET | Freq: Every day | ORAL | Status: DC
  Administered 2014-10-07 – 2014-10-10 (×4): 5 mg via ORAL
  Filled 2014-10-07 (×4): qty 1

## 2014-10-07 MED ORDER — LOSARTAN POTASSIUM 50 MG PO TABS
50.0000 mg | ORAL_TABLET | Freq: Every day | ORAL | Status: DC
  Administered 2014-10-07 – 2014-10-08 (×2): 50 mg via ORAL
  Filled 2014-10-07 (×3): qty 1

## 2014-10-07 MED ORDER — SOTALOL HCL 80 MG PO TABS
80.0000 mg | ORAL_TABLET | Freq: Two times a day (BID) | ORAL | Status: DC
  Administered 2014-10-07 – 2014-10-10 (×7): 80 mg via ORAL
  Filled 2014-10-07 (×9): qty 1

## 2014-10-07 MED ORDER — VITAMIN C 500 MG PO TABS
1000.0000 mg | ORAL_TABLET | Freq: Every day | ORAL | Status: DC
Start: 2014-10-07 — End: 2014-10-10
  Administered 2014-10-07 – 2014-10-10 (×4): 1000 mg via ORAL
  Filled 2014-10-07 (×4): qty 2

## 2014-10-07 MED ORDER — SODIUM CHLORIDE 0.9 % IJ SOLN
3.0000 mL | Freq: Two times a day (BID) | INTRAMUSCULAR | Status: DC
  Administered 2014-10-07 – 2014-10-08 (×3): 3 mL via INTRAVENOUS

## 2014-10-07 MED ORDER — MORPHINE SULFATE 2 MG/ML IJ SOLN
INTRAMUSCULAR | Status: AC
  Administered 2014-10-07: 2 mg via INTRAVENOUS
  Filled 2014-10-07: qty 1

## 2014-10-07 MED ORDER — ASPIRIN 81 MG PO CHEW
81.0000 mg | CHEWABLE_TABLET | ORAL | Status: AC
  Administered 2014-10-08: 81 mg via ORAL
  Filled 2014-10-07: qty 1

## 2014-10-07 MED ORDER — MORPHINE SULFATE 2 MG/ML IJ SOLN
1.0000 mg | INTRAMUSCULAR | Status: DC | PRN
  Administered 2014-10-09 (×2): 1 mg via INTRAVENOUS
  Filled 2014-10-07: qty 1

## 2014-10-07 MED ORDER — PREGABALIN 75 MG PO CAPS
75.0000 mg | ORAL_CAPSULE | Freq: Two times a day (BID) | ORAL | Status: DC
  Administered 2014-10-07 – 2014-10-10 (×7): 75 mg via ORAL
  Filled 2014-10-07 (×7): qty 1

## 2014-10-07 MED ORDER — RIVAROXABAN 20 MG PO TABS: 20.0000 mg | ORAL_TABLET | Freq: Every day | ORAL | Status: DC

## 2014-10-07 MED ORDER — MULTIVITAMINS PO CAPS: 1.0000 | ORAL_CAPSULE | Freq: Every day | ORAL | Status: DC

## 2014-10-07 MED ORDER — ASPIRIN EC 81 MG PO TBEC
81.0000 mg | DELAYED_RELEASE_TABLET | Freq: Every day | ORAL | Status: DC
  Administered 2014-10-07: 81 mg via ORAL
  Filled 2014-10-07: qty 1

## 2014-10-07 MED ORDER — SODIUM CHLORIDE 0.9 % WEIGHT BASED INFUSION
1.0000 mL/kg/h | INTRAVENOUS | Status: DC
  Administered 2014-10-08 – 2014-10-09 (×3): 1 mL/kg/h via INTRAVENOUS

## 2014-10-07 MED ORDER — ONDANSETRON HCL 4 MG/2ML IJ SOLN: 4.0000 mg | Freq: Four times a day (QID) | INTRAMUSCULAR | Status: DC | PRN

## 2014-10-07 MED ORDER — ACETAMINOPHEN 650 MG RE SUPP: 650.0000 mg | Freq: Four times a day (QID) | RECTAL | Status: DC | PRN

## 2014-10-07 MED ORDER — ENOXAPARIN SODIUM 120 MG/0.8ML ~~LOC~~ SOLN
1.0000 mg/kg | Freq: Two times a day (BID) | SUBCUTANEOUS | Status: DC
  Administered 2014-10-08 (×2): 105 mg via SUBCUTANEOUS
  Filled 2014-10-07 (×3): qty 0.8

## 2014-10-07 MED ORDER — SODIUM CHLORIDE 0.9 % IV SOLN: 250.0000 mL | INTRAVENOUS | Status: DC | PRN

## 2014-10-07 MED ORDER — ACETAMINOPHEN 325 MG PO TABS: 650.0000 mg | ORAL_TABLET | Freq: Four times a day (QID) | ORAL | Status: DC | PRN

## 2014-10-07 MED ORDER — INSULIN GLARGINE 100 UNIT/ML ~~LOC~~ SOLN
60.0000 [IU] | Freq: Two times a day (BID) | SUBCUTANEOUS | Status: DC
  Administered 2014-10-07 (×2): 60 [IU] via SUBCUTANEOUS
  Filled 2014-10-07 (×5): qty 0.6

## 2014-10-07 MED ORDER — MONTELUKAST SODIUM 10 MG PO TABS
10.0000 mg | ORAL_TABLET | Freq: Every day | ORAL | Status: DC
Start: 2014-10-07 — End: 2014-10-10
  Administered 2014-10-07 – 2014-10-09 (×3): 10 mg via ORAL
  Filled 2014-10-07 (×3): qty 1

## 2014-10-07 MED ORDER — ONDANSETRON HCL 4 MG PO TABS: 4.0000 mg | ORAL_TABLET | Freq: Four times a day (QID) | ORAL | Status: DC | PRN

## 2014-10-07 MED ORDER — ONDANSETRON HCL 4 MG/2ML IJ SOLN
INTRAMUSCULAR | Status: AC
  Administered 2014-10-07: 4 mg via INTRAVENOUS
  Filled 2014-10-07: qty 2

## 2014-10-07 MED ORDER — INSULIN ASPART 100 UNIT/ML ~~LOC~~ SOLN
0.0000 [IU] | Freq: Three times a day (TID) | SUBCUTANEOUS | Status: DC
  Administered 2014-10-07: 11 [IU] via SUBCUTANEOUS
  Administered 2014-10-07: 2 [IU] via SUBCUTANEOUS
  Administered 2014-10-07: 8 [IU] via SUBCUTANEOUS
  Administered 2014-10-08: 3 [IU] via SUBCUTANEOUS
  Administered 2014-10-08: 2 [IU] via SUBCUTANEOUS
  Administered 2014-10-09: 3 [IU] via SUBCUTANEOUS
  Administered 2014-10-10: 15 [IU] via SUBCUTANEOUS
  Administered 2014-10-10: 3 [IU] via SUBCUTANEOUS
  Filled 2014-10-07: qty 8
  Filled 2014-10-07: qty 11
  Filled 2014-10-07 (×2): qty 2
  Filled 2014-10-07 (×2): qty 3
  Filled 2014-10-07: qty 15
  Filled 2014-10-07: qty 3

## 2014-10-07 MED ORDER — FLUTICASONE PROPIONATE 50 MCG/ACT NA SUSP
1.0000 | Freq: Every day | NASAL | Status: DC
Start: 2014-10-07 — End: 2014-10-10
  Administered 2014-10-08 – 2014-10-10 (×3): 1 via NASAL
  Filled 2014-10-07: qty 16

## 2014-10-07 MED ORDER — SODIUM CHLORIDE 0.9 % WEIGHT BASED INFUSION
3.0000 mL/kg/h | INTRAVENOUS | Status: AC
  Administered 2014-10-08: 3 mL/kg/h via INTRAVENOUS

## 2014-10-07 NOTE — Progress Notes (Signed)
Thomas BucklerRodney Werner, is a 69 y.o. male, DOB - Apr 10, 1946, VWU:[id number]  Admit date - 10/06/2014   Admitting Physician Crissie FiguresEdavally N Reddy, MD  Outpatient Primary MD for the patient is No PCP Per Patient  LOS - 0   Chief Complaint  Patient presents with  . Chest Pain        Subjective: 69 year old male patient with hypertension, CAD status post CABG, type 2 diabetes mellitus, COPD, hyperlipidemia, sleep apnea, chronic A. fib, admitted because of chest pain. Patient had a recent stress test and echocardiogram with Dr.Shukat khan  and office yesterday. Patient noticed chest pain on the left side since last night took some aspirin with some relief. Initial troponins at 0.60, EKG unremarkable. Patient is admitted to observation status for chest pain with possible angina.    Thomas BucklerRodney Werner denies any chest pain now and resting comfortably.  Procedures none   Consults  cardio   Medications  Scheduled Meds: . aspirin EC  81 mg Oral Daily  . fluticasone  1 spray Each Nare Daily  . losartan  50 mg Oral Daily   And  . hydrochlorothiazide  12.5 mg Oral Daily  . insulin aspart  0-15 Units Subcutaneous TID WC  . insulin glargine  60 Units Subcutaneous BID  . linagliptin  5 mg Oral Daily  . montelukast  10 mg Oral QHS  . multivitamin with minerals  1 tablet Oral Daily  . pregabalin  75 mg Oral BID  . rivaroxaban  20 mg Oral Q supper  . sodium chloride  3 mL Intravenous Q12H  . sotalol  80 mg Oral BID  . vitamin C  1,000 mg Oral Daily   Continuous Infusions:   DVT Prophylaxis xarelto  Lab Results  Component Value Date   PLT 210 10/06/2014     Anti-infectives    None          Objective:   Filed Vitals:   10/07/14 0339 10/07/14 0400 10/07/14 0455 10/07/14 0511  BP: 97/61 109/59 103/36   Pulse: 96 82 89    Temp: 97.9 F (36.6 C)  98.3 F (36.8 C)   TempSrc: Oral  Oral   Resp: 22 20 18    Height:      Weight:   103.329 kg (227 lb 12.8 oz)   SpO2: 98% 99% 98% 98%    Wt Readings from Last 3 Encounters:  10/07/14 103.329 kg (227 lb 12.8 oz)     Intake/Output Summary (Last 24 hours) at 10/07/14 0752 Last data filed at 10/07/14 0500  Gross per 24 hour  Intake    120 ml  Output      0 ml  Net    120 ml     Physical Exam  Awake Alert, Oriented X 3, No new F.N deficits, Normal affect [place].AT,PERRAL Supple Neck,No JVD, No cervical lymphadenopathy appriciated.  Symmetrical Chest wall movement, Good air movement bilaterally, CTAB RRR,No Gallops,Rubs or new Murmurs, No Parasternal Heave +ve B.Sounds, Abd Soft, No tenderness, No organomegaly appriciated, No rebound - guarding or rigidity. No Cyanosis, Clubbing or edema, No new Rash or bruise     Data Review   Micro Results No results found for this or any previous visit (from the past 240 hour(s)).  Radiology Reports Dg  Chest Port 1 View  10/07/2014   CLINICAL DATA:  Acute onset of chest pain and shortness of breath. Initial encounter.  EXAM: PORTABLE CHEST - 1 VIEW  COMPARISON:  Chest radiograph performed 08/08/2014  FINDINGS: The lungs are mildly hypoexpanded. Vascular congestion is noted. Mild left basilar airspace opacity may reflect mild pneumonia or possibly asymmetric interstitial edema. No pleural effusion or pneumothorax is seen.  The cardiomediastinal silhouette is mildly enlarged. The patient is status post median sternotomy, with evidence of prior CABG. No acute osseous abnormalities are seen.  IMPRESSION: Lungs mildly hypoexpanded. Vascular congestion and mild cardiomegaly. Mild left basilar airspace opacity may reflect mild pneumonia or possibly asymmetric interstitial edema.   Electronically Signed   By: Roanna RaiderJeffery  Chang M.D.   On: 10/07/2014 00:18     CBC  Recent Labs Lab 10/06/14 2343  WBC 9.4  HGB 13.5  HCT 39.1*   PLT 210  MCV 87.4  MCH 30.1  MCHC 34.5  RDW 14.8*    Chemistries   Recent Labs Lab 10/06/14 2343 10/07/14 0437  NA 132* 134*  K 4.3 4.4  CL 99* 100*  CO2 22 28  GLUCOSE 411* 334*  BUN 21* 21*  CREATININE 1.11 1.13  CALCIUM 8.9 8.4*  AST 77* 59*  ALT 121* 106*  ALKPHOS 48 43  BILITOT 0.4 0.4   ------------------------------------------------------------------------------------------------------------------ estimated creatinine clearance is 74.4 mL/min (by C-G formula based on Cr of 1.13). ------------------------------------------------------------------------------------------------------------------ No results for input(s): HGBA1C in the last 72 hours. ------------------------------------------------------------------------------------------------------------------ No results for input(s): CHOL, HDL, LDLCALC, TRIG, CHOLHDL, LDLDIRECT in the last 72 hours. ------------------------------------------------------------------------------------------------------------------ No results for input(s): TSH, T4TOTAL, T3FREE, THYROIDAB in the last 72 hours.  Invalid input(s): FREET3 ------------------------------------------------------------------------------------------------------------------ No results for input(s): VITAMINB12, FOLATE, FERRITIN, TIBC, IRON, RETICCTPCT in the last 72 hours.  Coagulation profile  Recent Labs Lab 10/06/14 2347  INR 1.12    No results for input(s): DDIMER in the last 72 hours.  Cardiac Enzymes  Recent Labs Lab 10/06/14 2343 10/07/14 0437  TROPONINI <0.03 0.60*   ------------------------------------------------------------------------------------------------------------------ Invalid input(s): POCBNP   Assessment & Plan    Problem list:  Patient Active Problem List   Diagnosis Date Noted  . Chest pain 10/07/2014  . HTN (hypertension) 10/07/2014  . DM (diabetes mellitus) 10/07/2014  . COPD (chronic obstructive pulmonary  disease) 10/07/2014  . Coronary artery disease 10/07/2014    Principal Problem:   Chest pain Active Problems:   HTN (hypertension)   DM (diabetes mellitus)   COPD (chronic obstructive pulmonary disease)   Coronary artery disease  . Left-sided chest pain with radiation to left arm concerning for angina, resolved with aspirin and morphine. Troponin 0.60, continue him on aspirin, beta blockers, Xarelto. Patient says she he cannot take nitrates because of fall exploding headache and he does not want to take nitrates.  2. CAD/angina, status post CABG, stress test done with Dr. Jaquita FoldsShukat Khan  office cardiology consult is up appreciated . 3. Hypertension, stable on home medications. Continue same. 4. Atrial fib/flutter, controlled ventricular rate. Patient on Xarelto at home. Continue Xarelto and sotalol. 5. Diabetes mellitus type 2, on Lantus, stable. Continue Lantus, sliding scale insulin. 6. COPD/asthmatic bronchitis, stable on home medications. Continue same. 7. Hyperlipidemia on statin continue same. 8. Sleep apnea on CPAP. Continue same.  Code Status: full   Family Communication:  Disposition Plan:home     Time Spent in minutes  35 min   Armentha Branagan M.D on 10/07/2014 at 7:52 AM

## 2014-10-07 NOTE — H&P (Signed)
[facility] [place] [facility]   PATIENT NAME: Thomas BucklerRodney Werner    MR#:  [id number]  DATE OF BIRTH:  [birth year]  DATE OF ADMISSION:  10/06/2014  PRIMARY CARE PHYSICIAN: No PCP Per Patient Dr. Evalee MuttonEJAN- Sie  REQUESTING/REFERRING PHYSICIAN: Chiquita LothJade Sung  CHIEF COMPLAINT:   Chief Complaint  Patient presents with  . Chest Pain    HISTORY OF PRESENT ILLNESS:  Thomas BucklerRodney Werner  is a 69 y.o. male with a known history of hypertension, coronary artery disease/angina status post CABG, diabetes mellitus type 2, COPD/ asthmatic bronchitis, hyperlipidemia, GERD, sleep apnea, atrial fibrillation on Xarelto presents to the emergency room with the complaints of left-sided chest pain with radiation to left arm while he was trying to go to bed last night. No associated shortness of breath, no dizziness, no palpitations, no loss of consciousness. No nausea, vomiting, diarrhea, abdominal pain, constipation, diaphoresis. He took aspirin following which chest pain eased somewhat and came to the emergency room for further evaluation. In the emergency room on arrival patient was noted to have a sinus tachycardia with a rate of 1 42 bpm, later on heart rate improved with atrial fibrillation/flutter of 74 bpm. He received 1 mg of morphine in the ED, following which his chest pain resolved completely. He is currently completely chest pain-free and denies any complaints such as shortness of breath, dizziness. He has undergone a stress test on Thursday and he reports he will he was told it was borderline abnormal hence scheduled to see his cardiologist on Monday. His cardiologist Dr. Park BreedKahn was consulted by the ED physician who recommended to admit the patient for further observation and management.  PAST MEDICAL HISTORY:   Past Medical History  Diagnosis Date  . Asthma   . COPD (chronic obstructive pulmonary disease)     Early stages  . Diabetes mellitus without complication   . Hypertension     PAST  SURGICAL HISTORY:   Past Surgical History  Procedure Laterality Date  . Quadruple bypass      SOCIAL HISTORY:   History  Substance Use Topics  . Smoking status: Never Smoker   . Smokeless tobacco: Not on file  . Alcohol Use: No    FAMILY HISTORY:  History reviewed. No pertinent family history.  DRUG ALLERGIES:   Allergies  Allergen Reactions  . Ace Inhibitors     Feel terrible.  . Nitrates, Organic     Headache.  . Tetanus Toxoids Swelling    REVIEW OF SYSTEMS:   Review of Systems  Constitutional: Negative for fever, chills and malaise/fatigue.  HENT: Negative for ear pain, hearing loss, nosebleeds, sore throat and tinnitus.   Eyes: Negative for blurred vision, double vision, pain, discharge and redness.  Respiratory: Negative for cough, hemoptysis, sputum production, shortness of breath and wheezing.   Cardiovascular: Negative for chest pain, palpitations, orthopnea and leg swelling.       Episode of chest pain earlier as noted in history of present illness, which resolved completely at this time  Gastrointestinal: Negative for nausea, vomiting, abdominal pain, diarrhea, constipation, blood in stool and melena.  Genitourinary: Negative for dysuria, urgency, frequency and hematuria.  Musculoskeletal: Negative for back pain, joint pain and neck pain.  Skin: Negative for itching and rash.  Neurological: Negative for dizziness, tingling, sensory change, focal weakness and seizures.  Endo/Heme/Allergies: Does not bruise/bleed easily.  Psychiatric/Behavioral: Negative for depression. The patient is not nervous/anxious.     MEDICATIONS AT HOME:   Prior to Admission  medications   Medication Sig Start Date End Date Taking? Authorizing Provider  Ascorbic Acid (VITAMIN C) 1000 MG tablet Take 1,000 mg by mouth daily.   Yes Historical Provider, MD  aspirin 81 MG tablet Take 81 mg by mouth daily.   Yes Historical Provider, MD  insulin aspart (NOVOLOG) 100 UNIT/ML injection  Inject into the skin 3 (three) times daily before meals.   Yes Historical Provider, MD  insulin glargine (LANTUS) 100 UNIT/ML injection Inject into the skin at bedtime.   Yes Historical Provider, MD  losartan-hydrochlorothiazide (HYZAAR) 50-12.5 MG per tablet Take 1 tablet by mouth daily.   Yes Historical Provider, MD  mometasone (NASONEX) 50 MCG/ACT nasal spray Place 2 sprays into the nose daily.   Yes Historical Provider, MD  montelukast (SINGULAIR) 10 MG tablet Take 10 mg by mouth at bedtime.   Yes Historical Provider, MD  Multiple Vitamin (MULTIVITAMIN) capsule Take 1 capsule by mouth daily.   Yes Historical Provider, MD  pregabalin (LYRICA) 75 MG capsule Take 75 mg by mouth 2 (two) times daily.   Yes Historical Provider, MD  saxagliptin HCl (ONGLYZA) 2.5 MG TABS tablet Take 2.5 mg by mouth daily.   Yes Historical Provider, MD  sotalol (BETAPACE) 80 MG tablet Take 80 mg by mouth 2 (two) times daily.   Yes Historical Provider, MD      VITAL SIGNS:  Blood pressure 109/61, pulse 73, temperature 97.5 F (36.4 C), temperature source Oral, resp. rate 19, height  (1.803 m), weight 102.059 kg (225 lb), SpO2 99 %.  PHYSICAL EXAMINATION:  Physical Exam  Constitutional: He is oriented to person, place, and time. He appears well-developed and well-nourished. No distress.  HENT:  Head: Normocephalic and atraumatic.  Right Ear: External ear normal.  Left Ear: External ear normal.  Nose: Nose normal.  Mouth/Throat: Oropharynx is clear and moist. No oropharyngeal exudate.  Eyes: EOM are normal. Pupils are equal, round, and reactive to light. No scleral icterus.  Neck: Normal range of motion. Neck supple. No JVD present. No thyromegaly present.  Cardiovascular: Normal heart sounds and intact distal pulses.  Exam reveals no friction rub.   No murmur heard. Respiratory: Effort normal and breath sounds normal. No respiratory distress. He has no wheezes. He has no rales. He exhibits no tenderness.   GI: Soft. Bowel sounds are normal. He exhibits no distension and no mass. There is no tenderness. There is no rebound and no guarding.  Musculoskeletal: Normal range of motion. He exhibits no edema.  Lymphadenopathy:    He has no cervical adenopathy.  Neurological: He is alert and oriented to person, place, and time. He has normal reflexes. He displays normal reflexes. No cranial nerve deficit. He exhibits normal muscle tone.  Skin: Skin is warm. No rash noted. No erythema.  Psychiatric: He has a normal mood and affect. His behavior is normal. Thought content normal.   LABORATORY PANEL:   CBC  Recent Labs Lab 10/06/14 2343  WBC 9.4  HGB 13.5  HCT 39.1*  PLT 210   ------------------------------------------------------------------------------------------------------------------  Chemistries   Recent Labs Lab 10/06/14 2343  NA 132*  K 4.3  CL 99*  CO2 22  GLUCOSE 411*  BUN 21*  CREATININE 1.11  CALCIUM 8.9  AST 77*  ALT 121*  ALKPHOS 48  BILITOT 0.4   ------------------------------------------------------------------------------------------------------------------  Cardiac Enzymes  Recent Labs Lab 10/06/14 2343  TROPONINI <0.03   ------------------------------------------------------------------------------------------------------------------  RADIOLOGY:  Dg Chest Port 1 View  10/07/2014   CLINICAL DATA:  Acute onset of chest pain and shortness of breath. Initial encounter.  EXAM: PORTABLE CHEST - 1 VIEW  COMPARISON:  Chest radiograph performed 08/08/2014  FINDINGS: The lungs are mildly hypoexpanded. Vascular congestion is noted. Mild left basilar airspace opacity may reflect mild pneumonia or possibly asymmetric interstitial edema. No pleural effusion or pneumothorax is seen.  The cardiomediastinal silhouette is mildly enlarged. The patient is status post median sternotomy, with evidence of prior CABG. No acute osseous abnormalities are seen.  IMPRESSION: Lungs  mildly hypoexpanded. Vascular congestion and mild cardiomegaly. Mild left basilar airspace opacity may reflect mild pneumonia or possibly asymmetric interstitial edema.   Electronically Signed   By: Roanna RaiderJeffery  Chang M.D.   On: 10/07/2014 00:18    EKG:   Orders placed or performed during the hospital encounter of 10/06/14  . ED EKG initial EKG with sinus tachycardia with ventricular rate of 1 42 bpm,  no acute ischemic changes . Repeat EKG atrial flutter/fib with ventricular rate of 74 bpm   . ED EKG    IMPRESSION AND PLAN:   1. Left-sided chest pain with radiation to left arm concerning for angina, resolved with aspirin and morphine. Troponin 1 negative, EKG no acute ischemic changes. Plan: Admit to telemetry, cycle cardiac enzymes, continue beta blocker ,aspirin, statin. Consult cardiology for further advice. 2. CAD/angina, status post CABG, now with episode of chest pain. Plan as above. 3. Hypertension, stable on home medications. Continue same. 4. Atrial fib/flutter, controlled ventricular rate. Patient on Xarelto at home. Continue Xarelto and sotalol. 5. Diabetes mellitus type 2, on Lantus, stable. Continue Lantus, sliding scale insulin. 6. COPD/asthmatic bronchitis, stable on home medications. Continue same. 7. Hyperlipidemia on statin continue same. 8. Sleep apnea on CPAP. Continue same. 9. Patient on chronic anticoagulation, Xarelto. Continue same    All the records are reviewed and case discussed with ED provider. Management plans discussed with the patient, family and they are in agreement.  CODE STATUS: Full code  TOTAL TIME TAKING CARE OF THIS PATIENT: 50 minutes.    Crissie FiguresEDDY, Zakiah Gauthreaux N M.D on 10/07/2014 at 3:36 AM  Between 7am to 6pm - Pager - [phone]  After 6pm go to [email or web address] - password [email or web address] [facility]  [facility]  Office  [phone]  CC: Primary care physician; No PCP Per Patient

## 2014-10-07 NOTE — Care Management Note (Signed)
Case Management Note  Patient Details  Name: Thomas BucklerRodney Werner MRN: [id number] Date of Birth: 09-Nov-1945  Subjective/Objective:  Visit status change from inpatient to observation                   Action/Plan: Code 44 Observation letter given   Expected Discharge Date:                  Expected Discharge Plan:     In-House Referral:     Discharge planning Services     Post Acute Care Choice:    Choice offered to:     DME Arranged:    DME Agency:     HH Arranged:    HH Agency:     Status of Service:     Medicare Important Message Given:    Date Medicare IM Given:    Medicare IM give by:    Date Additional Medicare IM Given:    Additional Medicare Important Message give by:     If discussed at Long Length of Stay Meetings, dates discussed:    Additional Comments:  Caren MacadamMichelle Kendrea [place], RN 10/07/2014, 1:29 PM

## 2014-10-07 NOTE — Consult Note (Signed)
Thomas BucklerRodney Werner is a 69 y.o. male  [id number]  Primary Cardiologist: Adrian BlackwaterShaukat Anneli Bing Reason for Consultation: Acute coronary syndrome  HPI: This is a 69 year old white male with a past medical history hypertension non-insulin-dependent diabetes and coronary artery disease status post CABG presented to the emergency room with chest pain last night. Patient still has intermittent chest pain. Patient states that around 10:30 last night he started having heaviness in the chest which was very severe radiating to the left arm associated with shortness of breath. He was seen in the emergency room and was found to be in atrial fibrillation with rapid ventricular response rate heart rate was 142. Patient was given sotalol and he converted to sinus rhythm but then went back into atrial flutter again. Patient states that he all this started after he went home from the office. In the office he had no chest pain or shortness of breath. He had a stress Myoview on Thursday which showed mild inferior wall reversible defect. He had echocardiogram done on Friday which showed ejection fraction 53%.   Review of Systems: Review of Systems  Cardiovascular: Positive for chest pain and palpitations. Negative for orthopnea.  All other systems reviewed and are negative.     Past Medical History  Diagnosis Date  . Asthma   . COPD (chronic obstructive pulmonary disease)     Early stages  . Diabetes mellitus without complication   . Hypertension   . GERD (gastroesophageal reflux disease)   . Sleep apnea   . Coronary artery disease   . Anginal pain     Medications Prior to Admission  Medication Sig Dispense Refill  . Ascorbic Acid (VITAMIN C) 1000 MG tablet Take 1,000 mg by mouth daily.    Marland Kitchen. aspirin 81 MG tablet Take 81 mg by mouth daily.    . insulin aspart (NOVOLOG) 100 UNIT/ML injection Inject into the skin 3 (three) times daily before meals.    . insulin glargine (LANTUS) 100 UNIT/ML injection Inject into the  skin at bedtime.    Marland Kitchen. losartan-hydrochlorothiazide (HYZAAR) 50-12.5 MG per tablet Take 1 tablet by mouth daily.    . mometasone (NASONEX) 50 MCG/ACT nasal spray Place 2 sprays into the nose daily.    . montelukast (SINGULAIR) 10 MG tablet Take 10 mg by mouth at bedtime.    . Multiple Vitamin (MULTIVITAMIN) capsule Take 1 capsule by mouth daily.    . pregabalin (LYRICA) 75 MG capsule Take 75 mg by mouth 2 (two) times daily.    . rivaroxaban (XARELTO) 20 MG TABS tablet Take 20 mg by mouth 1 day or 1 dose.    . saxagliptin HCl (ONGLYZA) 2.5 MG TABS tablet Take 2.5 mg by mouth daily.    . sotalol (BETAPACE) 80 MG tablet Take 80 mg by mouth 2 (two) times daily.       . fluticasone  1 spray Each Nare Daily  . losartan  50 mg Oral Daily   And  . hydrochlorothiazide  12.5 mg Oral Daily  . insulin aspart  0-15 Units Subcutaneous TID WC  . insulin glargine  60 Units Subcutaneous BID  . linagliptin  5 mg Oral Daily  . montelukast  10 mg Oral QHS  . multivitamin with minerals  1 tablet Oral Daily  . pregabalin  75 mg Oral BID  . rivaroxaban  20 mg Oral Q supper  . sodium chloride  3 mL Intravenous Q12H  . sotalol  80 mg Oral BID  . vitamin C  1,000 mg Oral Daily    Infusions:    Allergies  Allergen Reactions  . Ace Inhibitors     Feel terrible.  . Nitrates, Organic     Headache.  . Tetanus Toxoids Swelling    History   Social History  . Marital Status: Married    Spouse Name: N/A  . Number of Children: N/A  . Years of Education: N/A   Occupational History  . Not on file.   Social History Main Topics  . Smoking status: Never Smoker   . Smokeless tobacco: Not on file  . Alcohol Use: No  . Drug Use: No  . Sexual Activity: Not on file   Other Topics Concern  . Not on file   Social History Narrative    Family History  Problem Relation Age of Onset  . Diabetes Mother   . Hypertension Mother   . COPD Father     PHYSICAL EXAM: Filed Vitals:   10/07/14 1100  BP:  107/61  Pulse:   Temp: 98 F (36.7 C)  Resp: 18     Intake/Output Summary (Last 24 hours) at 10/07/14 1119 Last data filed at 10/07/14 0907  Gross per 24 hour  Intake    360 ml  Output      0 ml  Net    360 ml    General:  Well appearing. No respiratory difficulty HEENT: normal Neck: supple. no JVD. Carotids 2+ bilat; no bruits. No lymphadenopathy or thryomegaly appreciated. Cor: PMI nondisplaced. Regular rate & rhythm. No rubs, gallops or murmurs. Lungs: clear Abdomen: soft, nontender, nondistended. No hepatosplenomegaly. No bruits or masses. Good bowel sounds. Extremities: no cyanosis, clubbing, rash, edema Neuro: alert & oriented x 3, cranial nerves grossly intact. moves all 4 extremities w/o difficulty. Affect pleasant.  ECG: EKG shows atrial flutter with variable block about 75 bpm nonspecific ST-T changes.  Results for orders placed or performed during the hospital encounter of 10/06/14 (from the past 24 hour(s))  CBC     Status: Abnormal   Collection Time: 10/06/14 11:43 PM  Result Value Ref Range   WBC 9.4 3.8 - 10.6 K/uL   RBC 4.48 4.40 - 5.90 MIL/uL   Hemoglobin 13.5 13.0 - 18.0 g/dL   HCT 14.739.1 (L) 82.940.0 - 56.252.0 %   MCV 87.4 80.0 - 100.0 fL   MCH 30.1 26.0 - 34.0 pg   MCHC 34.5 32.0 - 36.0 g/dL   RDW 13.014.8 (H) 86.511.5 - 78.414.5 %   Platelets 210 150 - 440 K/uL  Comprehensive metabolic panel     Status: Abnormal   Collection Time: 10/06/14 11:43 PM  Result Value Ref Range   Sodium 132 (L) 135 - 145 mmol/L   Potassium 4.3 3.5 - 5.1 mmol/L   Chloride 99 (L) 101 - 111 mmol/L   CO2 22 22 - 32 mmol/L   Glucose, Bld 411 (H) 65 - 99 mg/dL   BUN 21 (H) 6 - 20 mg/dL   Creatinine, Ser 6.961.11 0.61 - 1.24 mg/dL   Calcium 8.9 8.9 - 29.510.3 mg/dL   Total Protein 6.5 6.5 - 8.1 g/dL   Albumin 3.7 3.5 - 5.0 g/dL   AST 77 (H) 15 - 41 U/L   ALT 121 (H) 17 - 63 U/L   Alkaline Phosphatase 48 38 - 126 U/L   Total Bilirubin 0.4 0.3 - 1.2 mg/dL   GFR calc non Af Amer >60 >60 mL/min   GFR  calc Af Amer >60 >60 mL/min  Anion gap 11 5 - 15  Troponin I     Status: None   Collection Time: 10/06/14 11:43 PM  Result Value Ref Range   Troponin I <0.03 <0.031 ng/mL  Protime-INR     Status: None   Collection Time: 10/06/14 11:47 PM  Result Value Ref Range   Prothrombin Time 14.6 11.4 - 15.0 seconds   INR 1.12   Brain natriuretic peptide     Status: None   Collection Time: 10/06/14 11:47 PM  Result Value Ref Range   B Natriuretic Peptide 36.0 0.0 - 100.0 pg/mL  Troponin I     Status: Abnormal   Collection Time: 10/07/14  4:37 AM  Result Value Ref Range   Troponin I 0.60 (H) <0.031 ng/mL  Comprehensive metabolic panel     Status: Abnormal   Collection Time: 10/07/14  4:37 AM  Result Value Ref Range   Sodium 134 (L) 135 - 145 mmol/L   Potassium 4.4 3.5 - 5.1 mmol/L   Chloride 100 (L) 101 - 111 mmol/L   CO2 28 22 - 32 mmol/L   Glucose, Bld 334 (H) 65 - 99 mg/dL   BUN 21 (H) 6 - 20 mg/dL   Creatinine, Ser 1.19 0.61 - 1.24 mg/dL   Calcium 8.4 (L) 8.9 - 10.3 mg/dL   Total Protein 6.2 (L) 6.5 - 8.1 g/dL   Albumin 3.4 (L) 3.5 - 5.0 g/dL   AST 59 (H) 15 - 41 U/L   ALT 106 (H) 17 - 63 U/L   Alkaline Phosphatase 43 38 - 126 U/L   Total Bilirubin 0.4 0.3 - 1.2 mg/dL   GFR calc non Af Amer >60 >60 mL/min   GFR calc Af Amer >60 >60 mL/min   Anion gap 6 5 - 15  Glucose, capillary     Status: Abnormal   Collection Time: 10/07/14  7:38 AM  Result Value Ref Range   Glucose-Capillary 306 (H) 65 - 99 mg/dL   Comment 1 Notify RN    Dg Chest Port 1 View  10/07/2014   CLINICAL DATA:  Acute onset of chest pain and shortness of breath. Initial encounter.  EXAM: PORTABLE CHEST - 1 VIEW  COMPARISON:  Chest radiograph performed 08/08/2014  FINDINGS: The lungs are mildly hypoexpanded. Vascular congestion is noted. Mild left basilar airspace opacity may reflect mild pneumonia or possibly asymmetric interstitial edema. No pleural effusion or pneumothorax is seen.  The cardiomediastinal  silhouette is mildly enlarged. The patient is status post median sternotomy, with evidence of prior CABG. No acute osseous abnormalities are seen.  IMPRESSION: Lungs mildly hypoexpanded. Vascular congestion and mild cardiomegaly. Mild left basilar airspace opacity may reflect mild pneumonia or possibly asymmetric interstitial edema.   Electronically Signed   By: Roanna Raider M.D.   On: 10/07/2014 00:18     ASSESSMENT AND PLAN: Acute acute coronary syndrome associated with the paroxysmal atrial fibrillation and atrial flutter. There is mildly elevated troponin of 0.08. Patient continues to have intermittent chest pain. He has history of CABG at [facility] and having had cardiac catheterization for the past 6 years. Advise holding off on xarelto and continued Lovenox and we will schedule cardiac catheterization for Monday morning thank you very much for referral.  Diogenes Whirley A

## 2014-10-07 NOTE — Progress Notes (Signed)
ANTICOAGULATION CONSULT NOTE - Initial Consult  Pharmacy Consult for Lovenox Indication: chest pain/ACS  Allergies  Allergen Reactions   Ace Inhibitors     Feel terrible.   Nitrates, Organic     Headache.   Tetanus Toxoids Swelling    Patient Measurements: Height: 5\' 11"  (180.3 cm) Weight: 227 lb 12.8 oz (103.329 kg) IBW/kg (Calculated) : 75.3   Vital Signs: Temp: 98.3 F (36.8 C) (05/14 1128) Temp Source: Oral (05/14 1128) BP: 110/64 mmHg (05/14 1128) Pulse Rate: 76 (05/14 1128)  Labs:  Recent Labs  10/06/14 2343 10/06/14 2347 10/07/14 0437 10/07/14 1057  HGB 13.5  --   --   --   HCT 39.1*  --   --   --   PLT 210  --   --   --   LABPROT  --  14.6  --   --   INR  --  1.12  --   --   CREATININE 1.11  --  1.13  --   TROPONINI <0.03  --  0.60* 0.87*    Estimated Creatinine Clearance: 74.4 mL/min (by C-G formula based on Cr of 1.13).   Medical History: Past Medical History  Diagnosis Date   Asthma    COPD (chronic obstructive pulmonary disease)     Early stages   Diabetes mellitus without complication    Hypertension    GERD (gastroesophageal reflux disease)    Sleep apnea    Coronary artery disease    Anginal pain     Medications:  Scheduled:   [START ON 10/08/2014] aspirin  81 mg Oral Pre-Cath   enoxaparin (LOVENOX) injection  1 mg/kg Subcutaneous Q12H   fluticasone  1 spray Each Nare Daily   losartan  50 mg Oral Daily   And   hydrochlorothiazide  12.5 mg Oral Daily   insulin aspart  0-15 Units Subcutaneous TID WC   insulin glargine  60 Units Subcutaneous BID   linagliptin  5 mg Oral Daily   montelukast  10 mg Oral QHS   multivitamin with minerals  1 tablet Oral Daily   pregabalin  75 mg Oral BID   sodium chloride  3 mL Intravenous Q12H   sodium chloride  3 mL Intravenous Q12H   sotalol  80 mg Oral BID   vitamin C  1,000 mg Oral Daily    Assessment:  69 YO male with ACS associated with paroxysmal atrial  fibrillation (on Xarelto at home). Cardiologist on 5/14 advised continuing Lovenox rather than Xarelto due to planned cardiac cath on Monday.   Goal of Therapy:   Monitor platelets by anticoagulation protocol: Yes   Plan:  Ordered Lovenox 1 mg/kg (105 mg) q12h.    Ananias Pilgrimndrew S Norman 10/07/2014,2:04 PM

## 2014-10-07 NOTE — Progress Notes (Signed)
Dr. Betti Cruzeddy made aware of patient's critical troponin level of 0.60. No new orders received. Thomas GriffinsBenjamin C Annaliah Rivenbark 6:39 AM

## 2014-10-08 DIAGNOSIS — I482 Chronic atrial fibrillation: Secondary | ICD-10-CM | POA: Diagnosis not present

## 2014-10-08 LAB — GLUCOSE, CAPILLARY
GLUCOSE-CAPILLARY: 107 mg/dL — AB (ref 65–99)
Glucose-Capillary: 196 mg/dL — ABNORMAL HIGH (ref 65–99)
Glucose-Capillary: 150 mg/dL — ABNORMAL HIGH (ref 65–99)
Glucose-Capillary: 191 mg/dL — ABNORMAL HIGH (ref 65–99)

## 2014-10-08 MED ORDER — INSULIN GLARGINE 100 UNIT/ML ~~LOC~~ SOLN
30.0000 [IU] | Freq: Two times a day (BID) | SUBCUTANEOUS | Status: DC
  Administered 2014-10-08 – 2014-10-10 (×3): 30 [IU] via SUBCUTANEOUS
  Filled 2014-10-08 (×7): qty 0.3

## 2014-10-08 MED ORDER — IPRATROPIUM-ALBUTEROL 20-100 MCG/ACT IN AERS: 1.0000 | INHALATION_SPRAY | Freq: Four times a day (QID) | RESPIRATORY_TRACT | Status: DC

## 2014-10-08 MED ORDER — SODIUM CHLORIDE 0.9 % IJ SOLN: 3.0000 mL | INTRAMUSCULAR | Status: DC | PRN

## 2014-10-08 MED ORDER — IPRATROPIUM-ALBUTEROL 0.5-2.5 (3) MG/3ML IN SOLN
3.0000 mL | Freq: Four times a day (QID) | RESPIRATORY_TRACT | Status: DC
  Administered 2014-10-08 – 2014-10-09 (×4): 3 mL via RESPIRATORY_TRACT
  Filled 2014-10-08 (×4): qty 3

## 2014-10-08 MED ORDER — SODIUM CHLORIDE 0.9 % IV SOLN: 250.0000 mL | INTRAVENOUS | Status: DC

## 2014-10-08 MED ORDER — SODIUM CHLORIDE 0.9 % IJ SOLN: 3.0000 mL | Freq: Two times a day (BID) | INTRAMUSCULAR | Status: DC

## 2014-10-08 MED ORDER — MOMETASONE FURO-FORMOTEROL FUM 100-5 MCG/ACT IN AERO
2.0000 | INHALATION_SPRAY | Freq: Two times a day (BID) | RESPIRATORY_TRACT | Status: DC
  Administered 2014-10-08 – 2014-10-10 (×4): 2 via RESPIRATORY_TRACT
  Filled 2014-10-08: qty 8.8

## 2014-10-08 MED ORDER — CEFTRIAXONE SODIUM IN DEXTROSE 20 MG/ML IV SOLN
1.0000 g | INTRAVENOUS | Status: DC
  Administered 2014-10-08 – 2014-10-10 (×3): 1 g via INTRAVENOUS
  Filled 2014-10-08 (×4): qty 50

## 2014-10-08 MED ORDER — AZITHROMYCIN 250 MG PO TABS
250.0000 mg | ORAL_TABLET | Freq: Every day | ORAL | Status: DC
  Administered 2014-10-09 – 2014-10-10 (×2): 250 mg via ORAL
  Filled 2014-10-08 (×2): qty 1

## 2014-10-08 MED ORDER — AZITHROMYCIN 250 MG PO TABS
500.0000 mg | ORAL_TABLET | Freq: Every day | ORAL | Status: AC
  Administered 2014-10-08: 500 mg via ORAL
  Filled 2014-10-08: qty 2

## 2014-10-08 MED ORDER — SODIUM CHLORIDE 0.9 % IJ SOLN
3.0000 mL | Freq: Two times a day (BID) | INTRAMUSCULAR | Status: DC
  Administered 2014-10-08: 3 mL via INTRAVENOUS

## 2014-10-08 NOTE — Plan of Care (Signed)
Problem: Phase II Progression Outcomes Goal: Cath/PCI Day Path if indicated Outcome: Not Applicable Date Met:  10/08/14 Procedure scheduled for 5/16  Problem: Phase III Progression Outcomes Goal: Cath/PCI Path as indicated Outcome: Not Applicable Date Met:  10/08/14 Procedure to be performed 10/09/2014 Goal: Vascular site scale level 0 - I Vascular Site Scale Level 0: No bruising/bleeding/hematoma Level I (Mild): Bruising/Ecchymosis, minimal bleeding/ooozing, palpable hematoma < 3 cm Level II (Moderate): Bleeding not affecting hemodynamic parameters, pseudoaneurysm, palpable hematoma > 3 cm Level III (Severe) Bleeding which affects hemodynamic parameters or retroperitoneal hemorrhage  Outcome: Not Applicable Date Met:  10/08/14 Procedure to be performed 10/09/2014  Problem: Discharge Progression Outcomes Goal: Vascular site scale level 0 - I Vascular Site Scale Level 0: No bruising/bleeding/hematoma Level I (Mild): Bruising/Ecchymosis, minimal bleeding/ooozing, palpable hematoma < 3 cm Level II (Moderate): Bleeding not affecting hemodynamic parameters, pseudoaneurysm, palpable hematoma > 3 cm Level III (Severe) Bleeding which affects hemodynamic parameters or retroperitoneal hemorrhage  Outcome: Not Applicable Date Met:  10/08/14 Procedure 10/09/2014     

## 2014-10-08 NOTE — Progress Notes (Signed)
Pt. Rested well during the night with C-Pap in place. Fluids started this A.M. For procedure on Monday. No acute distress observed. No c/o pain. No SOB noted. Will continue to monitor pt.

## 2014-10-08 NOTE — Progress Notes (Signed)
SUBJECTIVE: Patient is still having intermittent left-sided chest pain. This chest pain is described as pressure type associated with shortness of breath.   Filed Vitals:   10/07/14 1128 10/07/14 2030 10/08/14 0459 10/08/14 1116  BP: 110/64 110/65 115/70 109/58  Pulse: 76 80 81 77  Temp: 98.3 F (36.8 C) 97.7 F (36.5 C) 97.3 F (36.3 C) 97.8 F (36.6 C)  TempSrc: Oral Oral Axillary Oral  Resp: 18 20 18 18   Height:      Weight:   103.602 kg (228 lb 6.4 oz)   SpO2: 97% 98% 98% 99%    Intake/Output Summary (Last 24 hours) at 10/08/14 1124 Last data filed at 10/08/14 0933  Gross per 24 hour  Intake  909.9 ml  Output   3075 ml  Net -2165.1 ml    LABS: Basic Metabolic Panel:  Recent Labs  16/02/9604/13/16 2343 10/07/14 0437  NA 132* 134*  K 4.3 4.4  CL 99* 100*  CO2 22 28  GLUCOSE 411* 334*  BUN 21* 21*  CREATININE 1.11 1.13  CALCIUM 8.9 8.4*   Liver Function Tests:  Recent Labs  10/06/14 2343 10/07/14 0437  AST 77* 59*  ALT 121* 106*  ALKPHOS 48 43  BILITOT 0.4 0.4  PROT 6.5 6.2*  ALBUMIN 3.7 3.4*   No results for input(s): LIPASE, AMYLASE in the last 72 hours. CBC:  Recent Labs  10/06/14 2343  WBC 9.4  HGB 13.5  HCT 39.1*  MCV 87.4  PLT 210   Cardiac Enzymes:  Recent Labs  10/07/14 0437 10/07/14 1057 10/07/14 1558  TROPONINI 0.60* 0.87* 0.77*   BNP: Invalid input(s): POCBNP D-Dimer: No results for input(s): DDIMER in the last 72 hours. Hemoglobin A1C: No results for input(s): HGBA1C in the last 72 hours. Fasting Lipid Panel: No results for input(s): CHOL, HDL, LDLCALC, TRIG, CHOLHDL, LDLDIRECT in the last 72 hours. Thyroid Function Tests: No results for input(s): TSH, T4TOTAL, T3FREE, THYROIDAB in the last 72 hours.  Invalid input(s): FREET3 Anemia Panel: No results for input(s): VITAMINB12, FOLATE, FERRITIN, TIBC, IRON, RETICCTPCT in the last 72 hours.   PHYSICAL EXAM General: Well developed, well nourished, in no acute  distress HEENT:  Normocephalic and atramatic Neck:  No JVD.  Lungs: Clear bilaterally to auscultation and percussion. Heart: HRRR . Normal S1 and S2 without gallops or murmurs.  Abdomen: Bowel sounds are positive, abdomen soft and non-tender  Msk:  Back normal, normal gait. Normal strength and tone for age. Extremities: No clubbing, cyanosis or edema.   Neuro: Alert and oriented X 3. Psych:  Good affect, responds appropriately  TELEMETRY: Monitor shows atrial flutter at Werner rate of 70 bpm.  ASSESSMENT AND PLAN: Unstable angina with paroxysmal atrial fibrillation and atrial flutter. Mildly elevated troponin. Patient continues to have intermittent chest pain with monitor showing atrial flutter. Advise adding cardioversion to left heart catheterization tomorrow morning. Orders for electrical cardioversion has been put in for tomorrow morning at 7:30.  Principal Problem:   Chest pain Active Problems:   HTN (hypertension)   DM (diabetes mellitus)   COPD (chronic obstructive pulmonary disease)   Coronary artery disease   Acute chest pain    [name],Thomas A, MD, [facility] HospitalFACC 10/08/2014 11:24 AM

## 2014-10-08 NOTE — Progress Notes (Signed)
[name], is a 69 y.o. male, DOB - 1945-11-25, HYQ:[id number]  Admit date - 10/06/2014   Admitting Physician Crissie FiguresEdavally N Reddy, MD  Outpatient Primary MD for the patient is No PCP Per Patient  LOS - 1         Subjective: 69 year old male patient with hypertension, CAD status post CABG, type 2 diabetes mellitus, COPD, hyperlipidemia, sleep apnea, chronic A. fib, admitted because of chest pain. Patient had a recent stress test and echocardiogram with Dr.Shukat khan  and office yesterday. Patient noticed chest pain on the left side since last night took some aspirin with some relief. Initial troponins at 0.60, EKG unremarkable. Patient is admitted to observation status for chest pain with possible angina.    [name] is seen today,has some cough and  Green phlem,has some  SOB.mild chest pain present,.  Procedures none   Consults  cardio   Medications  Scheduled Meds: . enoxaparin (LOVENOX) injection  1 mg/kg Subcutaneous Q12H  . fluticasone  1 spray Each Nare Daily  . losartan  50 mg Oral Daily   And  . hydrochlorothiazide  12.5 mg Oral Daily  . insulin aspart  0-15 Units Subcutaneous TID WC  . insulin glargine  60 Units Subcutaneous BID  . linagliptin  5 mg Oral Daily  . montelukast  10 mg Oral QHS  . multivitamin with minerals  1 tablet Oral Daily  . pregabalin  75 mg Oral BID  . sodium chloride  3 mL Intravenous Q12H  . sodium chloride  3 mL Intravenous Q12H  . sotalol  80 mg Oral BID  . vitamin C  1,000 mg Oral Daily   Continuous Infusions: . sodium chloride      DVT Prophylaxis xarelto  Lab Results  Component Value Date   PLT 210 10/06/2014     Anti-infectives    None          Objective:   Filed Vitals:   10/07/14 1100 10/07/14 1128 10/07/14 2030 10/08/14 0459  BP: 107/61  110/64 110/65 115/70  Pulse:  76 80 81  Temp: 98 F (36.7 C) 98.3 F (36.8 C) 97.7 F (36.5 C) 97.3 F (36.3 C)  TempSrc: Oral Oral Oral Axillary  Resp: 18 18 20 18   Height:      Weight:    103.602 kg (228 lb 6.4 oz)  SpO2: 93% 97% 98% 98%    Wt Readings from Last 3 Encounters:  10/08/14 103.602 kg (228 lb 6.4 oz)     Intake/Output Summary (Last 24 hours) at 10/08/14 0928 Last data filed at 10/08/14 0612  Gross per 24 hour  Intake  549.9 ml  Output   3075 ml  Net -2525.1 ml     Physical Exam  Awake Alert, Oriented X 3, No new F.N deficits, Normal affect [place].AT,PERRAL Supple Neck,No JVD, No cervical lymphadenopathy appriciated.  Symmetrical Chest wall movement, Good air movement bilaterally, CTAB RRR,No Gallops,Rubs or new Murmurs, No Parasternal Heave +ve B.Sounds, Abd Soft, No tenderness, No organomegaly appriciated, No rebound - guarding or rigidity. No Cyanosis, Clubbing or edema, No new Rash or bruise     Data Review   Micro Results No results found for this or any previous visit (from the past 240 hour(s)).  Radiology Reports  Dg Chest Port 1 View  10/07/2014   CLINICAL DATA:  Acute onset of chest pain and shortness of breath. Initial encounter.  EXAM: PORTABLE CHEST - 1 VIEW  COMPARISON:  Chest radiograph performed 08/08/2014  FINDINGS: The lungs are mildly hypoexpanded. Vascular congestion is noted. Mild left basilar airspace opacity may reflect mild pneumonia or possibly asymmetric interstitial edema. No pleural effusion or pneumothorax is seen.  The cardiomediastinal silhouette is mildly enlarged. The patient is status post median sternotomy, with evidence of prior CABG. No acute osseous abnormalities are seen.  IMPRESSION: Lungs mildly hypoexpanded. Vascular congestion and mild cardiomegaly. Mild left basilar airspace opacity may reflect mild pneumonia or possibly asymmetric interstitial edema.   Electronically Signed   By: Roanna Raider M.D.   On: 10/07/2014  00:18     CBC  Recent Labs Lab 10/06/14 2343  WBC 9.4  HGB 13.5  HCT 39.1*  PLT 210  MCV 87.4  MCH 30.1  MCHC 34.5  RDW 14.8*    Chemistries   Recent Labs Lab 10/06/14 2343 10/07/14 0437  NA 132* 134*  K 4.3 4.4  CL 99* 100*  CO2 22 28  GLUCOSE 411* 334*  BUN 21* 21*  CREATININE 1.11 1.13  CALCIUM 8.9 8.4*  AST 77* 59*  ALT 121* 106*  ALKPHOS 48 43  BILITOT 0.4 0.4   ------------------------------------------------------------------------------------------------------------------ estimated creatinine clearance is 74.5 mL/min (by C-G formula based on Cr of 1.13). ------------------------------------------------------------------------------------------------------------------ No results for input(s): HGBA1C in the last 72 hours. ------------------------------------------------------------------------------------------------------------------ No results for input(s): CHOL, HDL, LDLCALC, TRIG, CHOLHDL, LDLDIRECT in the last 72 hours. ------------------------------------------------------------------------------------------------------------------ No results for input(s): TSH, T4TOTAL, T3FREE, THYROIDAB in the last 72 hours.  Invalid input(s): FREET3 ------------------------------------------------------------------------------------------------------------------ No results for input(s): VITAMINB12, FOLATE, FERRITIN, TIBC, IRON, RETICCTPCT in the last 72 hours.  Coagulation profile  Recent Labs Lab 10/06/14 2347  INR 1.12    No results for input(s): DDIMER in the last 72 hours.  Cardiac Enzymes  Recent Labs Lab 10/07/14 0437 10/07/14 1057 10/07/14 1558  TROPONINI 0.60* 0.87* 0.77*   ------------------------------------------------------------------------------------------------------------------ Invalid input(s): POCBNP   Assessment & Plan    Problem list:  Patient Active Problem List   Diagnosis Date Noted  . Chest pain 10/07/2014  . HTN  (hypertension) 10/07/2014  . DM (diabetes mellitus) 10/07/2014  . COPD (chronic obstructive pulmonary disease) 10/07/2014  . Coronary artery disease 10/07/2014  . Acute chest pain 10/07/2014    Principal Problem:   Chest pain Active Problems:   HTN (hypertension)   DM (diabetes mellitus)   COPD (chronic obstructive pulmonary disease)   Coronary artery disease   Acute chest pain  . Left-sided chest pain with radiation to left arm concerning for angina, resolved with aspirin and morphine. Troponin 0.60, continue him on aspirin, beta blockers,xarelto on hold  For cardiac cath in Am  Patient says she he cannot take nitrates because of fall exploding headache and he does not want to take nitrates.  2. CAD/angina, status post CABG, stress test done with Dr. Astrid Divine with cardio for cardiaccath tomorow . 3. Hypertension, stable on home medications. Continue same. 4. Atrial fib/flutter, controlled ventricular rate. lovenox   and sotalol.xarelto on hold, 5. Diabetes mellitus type 2, on Lantus, stable. Continue Lantus, sliding scale insulin. 6. COPD/asthmatic bronchitis,continue combivent/advair/start abx. With rocpehin,zithromax 7. Hyperlipidemia on statin continue same. 8. Sleep apnea on CPAP. Continue same. 9. Code Status: full   Family Communication:  Disposition Plan:home     Time Spent in minutes  35 min   Garyn Arlotta M.D on 10/08/2014 at 9:28 AM

## 2014-10-09 ENCOUNTER — Observation Stay: Payer: Medicare Other | Admitting: Anesthesiology

## 2014-10-09 ENCOUNTER — Encounter: Payer: Self-pay | Admitting: Anesthesiology

## 2014-10-09 ENCOUNTER — Encounter
Admission: EM | Disposition: A | Discharge status: Home / Self Care | Payer: Self-pay | Source: Home / Self Care | Attending: Internal Medicine

## 2014-10-09 ENCOUNTER — Encounter
Admission: EM | Disposition: A | Discharge status: Home / Self Care | Payer: PRIVATE HEALTH INSURANCE | Source: Home / Self Care | Attending: Internal Medicine

## 2014-10-09 DIAGNOSIS — I482 Chronic atrial fibrillation: Secondary | ICD-10-CM | POA: Diagnosis not present

## 2014-10-09 HISTORY — PX: CARDIAC CATHETERIZATION: SHX172

## 2014-10-09 HISTORY — PX: ELECTROPHYSIOLOGIC STUDY: SHX172A

## 2014-10-09 LAB — GLUCOSE, CAPILLARY
GLUCOSE-CAPILLARY: 206 mg/dL — AB (ref 65–99)
Glucose-Capillary: 142 mg/dL — ABNORMAL HIGH (ref 65–99)
Glucose-Capillary: 104 mg/dL — ABNORMAL HIGH (ref 65–99)
Glucose-Capillary: 184 mg/dL — ABNORMAL HIGH (ref 65–99)

## 2014-10-09 LAB — CKMB (ARMC ONLY): CK, MB: 0.5 ng/mL (ref 0.5–5.0)

## 2014-10-09 LAB — TROPONIN I
TROPONIN I: 0.31 ng/mL — AB (ref <0.031)
TROPONIN I: 0.26 ng/mL — AB (ref <0.031)
Troponin I: 0.26 ng/mL — ABNORMAL HIGH (ref <0.031)

## 2014-10-09 SURGERY — CARDIOVERSION (CATH LAB)
Anesthesia: General

## 2014-10-09 SURGERY — LEFT HEART CATH
Anesthesia: Moderate Sedation | Laterality: Left

## 2014-10-09 MED ORDER — NITROGLYCERIN 2 % TD OINT: 1.0000 [in_us] | TOPICAL_OINTMENT | Freq: Four times a day (QID) | TRANSDERMAL | Status: DC

## 2014-10-09 MED ORDER — METOPROLOL SUCCINATE ER 25 MG PO TB24
25.0000 mg | ORAL_TABLET | Freq: Every day | ORAL | Status: DC
  Administered 2014-10-09 – 2014-10-10 (×2): 25 mg via ORAL
  Filled 2014-10-09 (×2): qty 1

## 2014-10-09 MED ORDER — SODIUM CHLORIDE 0.9 % IV SOLN: 250.0000 mL | INTRAVENOUS | Status: DC | PRN

## 2014-10-09 MED ORDER — SODIUM CHLORIDE 0.9 % IJ SOLN: 3.0000 mL | INTRAMUSCULAR | Status: DC | PRN

## 2014-10-09 MED ORDER — FENTANYL CITRATE (PF) 100 MCG/2ML IJ SOLN
INTRAMUSCULAR | Status: DC | PRN
  Administered 2014-10-09: 25 ug via INTRAVENOUS

## 2014-10-09 MED ORDER — SODIUM CHLORIDE 0.9 % IV SOLN: 250.0000 mL | INTRAVENOUS | Status: DC

## 2014-10-09 MED ORDER — SODIUM CHLORIDE 0.9 % IJ SOLN: 3.0000 mL | Freq: Two times a day (BID) | INTRAMUSCULAR | Status: DC

## 2014-10-09 MED ORDER — SODIUM CHLORIDE 0.9 % WEIGHT BASED INFUSION: 3.0000 mL/kg/h | INTRAVENOUS | Status: DC

## 2014-10-09 MED ORDER — FENTANYL CITRATE (PF) 100 MCG/2ML IJ SOLN
INTRAMUSCULAR | Status: AC
  Filled 2014-10-09: qty 2

## 2014-10-09 MED ORDER — MORPHINE SULFATE 2 MG/ML IJ SOLN
INTRAMUSCULAR | Status: AC
  Filled 2014-10-09: qty 1

## 2014-10-09 MED ORDER — NITROGLYCERIN IN D5W 200-5 MCG/ML-% IV SOLN
INTRAVENOUS | Status: AC
Start: 2014-10-09 — End: 2014-10-09
  Filled 2014-10-09: qty 250

## 2014-10-09 MED ORDER — RIVAROXABAN 20 MG PO TABS: 20.0000 mg | ORAL_TABLET | Freq: Every day | ORAL | Status: DC

## 2014-10-09 MED ORDER — ASPIRIN 81 MG PO CHEW
81.0000 mg | CHEWABLE_TABLET | ORAL | Status: AC
  Administered 2014-10-10: 81 mg via ORAL
  Filled 2014-10-09: qty 1

## 2014-10-09 MED ORDER — IODIXANOL 320 MG/ML IV SOLN
INTRAVENOUS | Status: DC | PRN
  Administered 2014-10-09: 150 mL via INTRA_ARTERIAL
  Administered 2014-10-09: 100 mL via INTRA_ARTERIAL

## 2014-10-09 MED ORDER — ACETAMINOPHEN 325 MG PO TABS
650.0000 mg | ORAL_TABLET | ORAL | Status: DC | PRN
  Administered 2014-10-09: 650 mg via ORAL
  Filled 2014-10-09: qty 2

## 2014-10-09 MED ORDER — NITROGLYCERIN IN D5W 200-5 MCG/ML-% IV SOLN
5.0000 ug/min | INTRAVENOUS | Status: DC
  Administered 2014-10-09: 5 ug/min via INTRAVENOUS

## 2014-10-09 MED ORDER — ONDANSETRON HCL 4 MG/2ML IJ SOLN: 4.0000 mg | Freq: Four times a day (QID) | INTRAMUSCULAR | Status: DC | PRN

## 2014-10-09 MED ORDER — HYDROCHLOROTHIAZIDE 12.5 MG PO CAPS
12.5000 mg | ORAL_CAPSULE | Freq: Every day | ORAL | Status: DC
  Administered 2014-10-09 – 2014-10-10 (×2): 12.5 mg via ORAL
  Filled 2014-10-09 (×2): qty 1

## 2014-10-09 MED ORDER — FENTANYL CITRATE (PF) 100 MCG/2ML IJ SOLN: 25.0000 ug | INTRAMUSCULAR | Status: DC | PRN

## 2014-10-09 MED ORDER — MIDAZOLAM HCL 2 MG/2ML IJ SOLN
INTRAMUSCULAR | Status: DC | PRN
  Administered 2014-10-09: 1 mg via INTRAVENOUS

## 2014-10-09 MED ORDER — ONDANSETRON HCL 4 MG/2ML IJ SOLN: 4.0000 mg | Freq: Once | INTRAMUSCULAR | Status: AC | PRN

## 2014-10-09 MED ORDER — PROPOFOL 10 MG/ML IV BOLUS
INTRAVENOUS | Status: DC | PRN
  Administered 2014-10-09: 90 mg via INTRAVENOUS

## 2014-10-09 MED ORDER — MIDAZOLAM HCL 2 MG/2ML IJ SOLN
INTRAMUSCULAR | Status: AC
  Filled 2014-10-09: qty 2

## 2014-10-09 MED ORDER — LOSARTAN POTASSIUM 50 MG PO TABS
50.0000 mg | ORAL_TABLET | Freq: Every day | ORAL | Status: DC
  Administered 2014-10-09 – 2014-10-10 (×2): 50 mg via ORAL
  Filled 2014-10-09: qty 1

## 2014-10-09 MED ORDER — HEPARIN (PORCINE) IN NACL 2-0.9 UNIT/ML-% IJ SOLN
INTRAMUSCULAR | Status: AC
  Filled 2014-10-09: qty 1000

## 2014-10-09 MED ORDER — PANTOPRAZOLE SODIUM 40 MG PO TBEC
40.0000 mg | DELAYED_RELEASE_TABLET | Freq: Every day | ORAL | Status: DC
  Administered 2014-10-09 – 2014-10-10 (×2): 40 mg via ORAL
  Filled 2014-10-09 (×2): qty 1

## 2014-10-09 MED ORDER — AZITHROMYCIN 250 MG PO TABS: ORAL_TABLET | ORAL | Status: DC

## 2014-10-09 MED ORDER — SODIUM CHLORIDE 0.9 % WEIGHT BASED INFUSION: 1.0000 mL/kg/h | INTRAVENOUS | Status: DC

## 2014-10-09 MED ORDER — PRASUGREL HCL 10 MG PO TABS
60.0000 mg | ORAL_TABLET | Freq: Once | ORAL | Status: AC
  Administered 2014-10-09: 60 mg via ORAL
  Filled 2014-10-09: qty 6

## 2014-10-09 MED ORDER — METOPROLOL SUCCINATE ER 25 MG PO TB24: 25.0000 mg | ORAL_TABLET | Freq: Every day | ORAL | Status: AC

## 2014-10-09 MED ORDER — RANOLAZINE ER 500 MG PO TB12
500.0000 mg | ORAL_TABLET | Freq: Two times a day (BID) | ORAL | Status: DC
  Administered 2014-10-09 – 2014-10-10 (×3): 500 mg via ORAL
  Filled 2014-10-09 (×3): qty 1

## 2014-10-09 MED ORDER — NITROGLYCERIN 2 % TD OINT
1.0000 [in_us] | TOPICAL_OINTMENT | Freq: Four times a day (QID) | TRANSDERMAL | Status: DC
  Administered 2014-10-09 (×2): 1 [in_us] via TOPICAL
  Filled 2014-10-09 (×2): qty 1

## 2014-10-09 MED ORDER — RANOLAZINE ER 500 MG PO TB12: 500.0000 mg | ORAL_TABLET | Freq: Two times a day (BID) | ORAL | Status: DC

## 2014-10-09 MED ORDER — PRASUGREL HCL 10 MG PO TABS
10.0000 mg | ORAL_TABLET | Freq: Every day | ORAL | Status: DC
  Administered 2014-10-10: 10 mg via ORAL
  Filled 2014-10-09: qty 1

## 2014-10-09 MED ORDER — IPRATROPIUM-ALBUTEROL 0.5-2.5 (3) MG/3ML IN SOLN: 3.0000 mL | Freq: Four times a day (QID) | RESPIRATORY_TRACT | Status: DC | PRN

## 2014-10-09 SURGICAL SUPPLY — 9 items
CATH INFINITI 5 FR 3DRC (CATHETERS) ×2 IMPLANT
CATH INFINITI 5 FR IM (CATHETERS) ×2 IMPLANT
CATH INFINITI 5FR ANG PIGTAIL (CATHETERS) ×2 IMPLANT
CATH INFINITI 5FR JL4 (CATHETERS) ×2 IMPLANT
CATH INFINITI JR4 5F (CATHETERS) ×2 IMPLANT
CATH JB2 5.5X125 (CATHETERS) ×2 IMPLANT
DEVICE CLOSURE MYNXGRIP 5F (Vascular Products) ×2 IMPLANT
SHEATH AVANTI 5FR X 11CM (SHEATH) ×2 IMPLANT
WIRE EMERALD 3MM-J .035X150CM (WIRE) ×2 IMPLANT

## 2014-10-09 NOTE — OR Nursing (Signed)
09:17 electrical cardioverted with 100 joules and converted to Sinus Huston FoleyBrady.  0920 taken to cath lab per Dr. Milta DeitersKhan's order for heart cath. And will do EKG post catheterization. Cath lab notified that patient has sleep apnea

## 2014-10-09 NOTE — Progress Notes (Signed)
[name], is a 69 y.o. male, DOB - 03-Dec-1945, ZOX:[id number]  Admit date - 10/06/2014   Admitting Physician Crissie Figures, MD  Outpatient Primary MD for the patient is No PCP Per Patient  LOS - 2   Chief Complaint  Patient presents with  . Chest Pain        Subjective:  Seen at bedside.discharge cancelled due to chest pain.   [name] denies any chest pain now and resting comfortably.  Procedures none   Consults  cardio   Medications  Scheduled Meds: . [START ON 10/10/2014] aspirin  81 mg Oral Pre-Cath  . azithromycin  250 mg Oral Daily  . cefTRIAXone (ROCEPHIN)  IV  1 g Intravenous Q24H  . fluticasone  1 spray Each Nare Daily  . losartan  50 mg Oral Daily   And  . hydrochlorothiazide  12.5 mg Oral Daily  . insulin aspart  0-15 Units Subcutaneous TID WC  . insulin glargine  30 Units Subcutaneous BID  . linagliptin  5 mg Oral Daily  . losartan  50 mg Oral Daily  . metoprolol succinate  25 mg Oral Daily  . mometasone-formoterol  2 puff Inhalation BID  . montelukast  10 mg Oral QHS  . morphine      . multivitamin with minerals  1 tablet Oral Daily  . nitroGLYCERIN  1 inch Topical 4 times per day  . pantoprazole  40 mg Oral Daily  . [START ON 10/10/2014] prasugrel  10 mg Oral Daily  . prasugrel  60 mg Oral Once  . pregabalin  75 mg Oral BID  . ranolazine  500 mg Oral BID  . sodium chloride  3 mL Intravenous Q12H  . sodium chloride  3 mL Intravenous Q12H  . sodium chloride  3 mL Intravenous Q12H  . sodium chloride  3 mL Intravenous Q12H  . sotalol  80 mg Oral BID  . vitamin C  1,000 mg Oral Daily   Continuous Infusions: . sodium chloride    . sodium chloride    . sodium chloride 1 mL/kg/hr (10/09/14 0415)  . [START ON 10/10/2014] sodium chloride     Followed by  . [START ON  10/10/2014] sodium chloride    . sodium chloride    . nitroGLYCERIN 5 mcg/min (10/09/14 1146)    DVT Prophylaxis xarelto  Lab Results  Component Value Date   PLT 210 10/06/2014     Anti-infectives    Start     Dose/Rate Route Frequency Ordered Stop   10/09/14 1000  azithromycin (ZITHROMAX) tablet 250 mg     250 mg Oral Daily 10/08/14 0939 10/13/14 0959   10/09/14 0000  azithromycin (ZITHROMAX) 250 MG tablet        10/09/14 1041     10/08/14 1000  azithromycin (ZITHROMAX) tablet 500 mg     500 mg Oral Daily 10/08/14 0939 10/08/14 1117   10/08/14 0945  cefTRIAXone (ROCEPHIN) 1 g in dextrose 5 % 50 mL IVPB - Premix     1 g 100 mL/hr over 30 Minutes Intravenous Every 24 hours 10/08/14 0939            Objective:   Filed Vitals:   10/09/14 1025 10/09/14 1027 10/09/14 1052 10/09/14 1145  BP: 115/72  116/71 120/68 120/70  Pulse: 74 74    Temp:      TempSrc:      Resp: 12 25    Height:      Weight:      SpO2: 100% 100%  97%    Wt Readings from Last 3 Encounters:  10/09/14 102.331 kg (225 lb 9.6 oz)     Intake/Output Summary (Last 24 hours) at 10/09/14 1306 Last data filed at 10/09/14 1056  Gross per 24 hour  Intake   1340 ml  Output   2900 ml  Net  -1560 ml     Physical Exam  Awake Alert, Oriented X 3, No new F.N deficits, Normal affect [place].AT,PERRAL Supple Neck,No JVD, No cervical lymphadenopathy appriciated.  Symmetrical Chest wall movement, Good air movement bilaterally, CTAB RRR,No Gallops,Rubs or new Murmurs, No Parasternal Heave +ve B.Sounds, Abd Soft, No tenderness, No organomegaly appriciated, No rebound - guarding or rigidity. No Cyanosis, Clubbing or edema, No new Rash or bruise     Data Review   Micro Results No results found for this or any previous visit (from the past 240 hour(s)).  Radiology Reports Dg Chest Port 1 View  10/07/2014   CLINICAL DATA:  Acute onset of chest pain and shortness of breath. Initial encounter.  EXAM: PORTABLE  CHEST - 1 VIEW  COMPARISON:  Chest radiograph performed 08/08/2014  FINDINGS: The lungs are mildly hypoexpanded. Vascular congestion is noted. Mild left basilar airspace opacity may reflect mild pneumonia or possibly asymmetric interstitial edema. No pleural effusion or pneumothorax is seen.  The cardiomediastinal silhouette is mildly enlarged. The patient is status post median sternotomy, with evidence of prior CABG. No acute osseous abnormalities are seen.  IMPRESSION: Lungs mildly hypoexpanded. Vascular congestion and mild cardiomegaly. Mild left basilar airspace opacity may reflect mild pneumonia or possibly asymmetric interstitial edema.   Electronically Signed   By: Roanna RaiderJeffery  Chang M.D.   On: 10/07/2014 00:18     CBC  Recent Labs Lab 10/06/14 2343  WBC 9.4  HGB 13.5  HCT 39.1*  PLT 210  MCV 87.4  MCH 30.1  MCHC 34.5  RDW 14.8*    Chemistries   Recent Labs Lab 10/06/14 2343 10/07/14 0437  NA 132* 134*  K 4.3 4.4  CL 99* 100*  CO2 22 28  GLUCOSE 411* 334*  BUN 21* 21*  CREATININE 1.11 1.13  CALCIUM 8.9 8.4*  AST 77* 59*  ALT 121* 106*  ALKPHOS 48 43  BILITOT 0.4 0.4   ------------------------------------------------------------------------------------------------------------------ estimated creatinine clearance is 74.1 mL/min (by C-G formula based on Cr of 1.13). ------------------------------------------------------------------------------------------------------------------ No results for input(s): HGBA1C in the last 72 hours. ------------------------------------------------------------------------------------------------------------------ No results for input(s): CHOL, HDL, LDLCALC, TRIG, CHOLHDL, LDLDIRECT in the last 72 hours. ------------------------------------------------------------------------------------------------------------------ No results for input(s): TSH, T4TOTAL, T3FREE, THYROIDAB in the last 72 hours.  Invalid input(s):  FREET3 ------------------------------------------------------------------------------------------------------------------ No results for input(s): VITAMINB12, FOLATE, FERRITIN, TIBC, IRON, RETICCTPCT in the last 72 hours.  Coagulation profile  Recent Labs Lab 10/06/14 2347  INR 1.12    No results for input(s): DDIMER in the last 72 hours.  Cardiac Enzymes  Recent Labs Lab 10/07/14 0437 10/07/14 1057 10/07/14 1558  TROPONINI 0.60* 0.87* 0.77*   ------------------------------------------------------------------------------------------------------------------ Invalid input(s): POCBNP   Assessment & Plan    Problem list:  Patient Active Problem List   Diagnosis Date Noted  . Chest pain 10/07/2014  . HTN (hypertension) 10/07/2014  . DM (diabetes mellitus) 10/07/2014  . COPD (chronic obstructive pulmonary disease)  10/07/2014  . Coronary artery disease 10/07/2014  . Acute chest pain 10/07/2014    Principal Problem:   Chest pain Active Problems:   HTN (hypertension)   DM (diabetes mellitus)   COPD (chronic obstructive pulmonary disease)   Coronary artery disease   Acute chest pain  . Left-sided chest pain with radiation to left arm concerning for angina, resolved with aspirin and morphine. Troponin 0.60, continue him on aspirin, beta blockers,effient. Status post cardiac catheter today. Patient had normal bypass graft. But because of ongoing chest pain Dr. Welton FlakesKhan  wanted to monitor 1 more day on telemetry. We will start him on Protonix also for possible GERD  2. CAD/angina, status post CABG, stress test done with Dr. Jaquita FoldsShukat Khan  office cardiology consult is up appreciated . 3. Hypertension, stable on home medications. Continue same. 4. Atrial fib/flutter, controlled ventricular rate. Patient on Xarelto at home. Continue Xarelto and sotalol. 5. Diabetes mellitus type 2, on Lantus, stable. Continue Lantus, sliding scale insulin. 6. COPD/asthmatic bronchitis, stable on  home medications. Continue same. 7. Hyperlipidemia on statin continue same. 8. Sleep apnea on CPAP. Continue same.  Code Status: full   Family Communication:   Disposition Plan:home     Time Spent in minutes  35 min   Cyree Chuong M.D on 10/09/2014 at 1:06 PM

## 2014-10-09 NOTE — Anesthesia Postprocedure Evaluation (Signed)
  Anesthesia Post-op Note  Patient: Thomas BucklerRodney Hopkins  Procedure(s) Performed: Procedure(s): CARDIOVERSION (N/A)  Anesthesia type:General  Patient location: PACU  Post pain: Pain level controlled  Post assessment: Post-op Vital signs reviewed, Patient's Cardiovascular Status Stable, Respiratory Function Stable, Patent Airway and No signs of Nausea or vomiting  Post vital signs: Reviewed and stable  Last Vitals:  Filed Vitals:   10/09/14 1052  BP: 120/68  Pulse:   Temp:   Resp:     Level of consciousness: awake, alert  and patient cooperative  Complications: No apparent anesthesia complications

## 2014-10-09 NOTE — Discharge Summary (Signed)
Thomas BucklerRodney Werner, is a 69 y.o. male  DOB April 18, 1946  MRN [id number].  Admission date:  10/06/2014  Admitting Physician  Crissie FiguresEdavally N Reddy, MD  Discharge Date:  10/09/2014   Primary MD  No PCP Per Patient  Recommendations for primary care physician for things to follow: Follow-up with cardiology Dr. Welton FlakesKhan in one week.   Admission Diagnosis  Hyperglycemia [R73.9] Atrial fibrillation and flutter [I48.91, I48.92] Chest pain, unspecified chest pain type [R07.9]   Discharge Diagnosis  Hyperglycemia [R73.9] Atrial fibrillation and flutter [I48.91, I48.92] Chest pain, unspecified chest pain type [R07.9]    Principal Problem:   Chest pain Active Problems:   HTN (hypertension)   DM (diabetes mellitus)   COPD (chronic obstructive pulmonary disease)   Coronary artery disease   Acute chest pain      Past Medical History  Diagnosis Date  . Asthma   . COPD (chronic obstructive pulmonary disease)     Early stages  . Diabetes mellitus without complication   . Hypertension   . GERD (gastroesophageal reflux disease)   . Sleep apnea   . Coronary artery disease   . Anginal pain     Past Surgical History  Procedure Laterality Date  . Quadruple bypass    . Hernia repair    . Coronary artery bypass graft         History of present illness and  Hospital Course:     Kindly see H&P for history of present illness and admission details, please review complete Labs, Consult reports and Test reports for all details in brief  HPI  from the history and physical done on the day of admission    Hospital Course 69 year old male male patient with history of COPD, CAD status post CABG, type 2 diabetes mellitus, hyperlipidemia, sleep apnea, chronic atrial fibrillation on Xarelto comes in because of left-sided chest pain going to the  left arm.. Patient found to have a sinus tachycardia at 1 42 bpm on admission. Patient's cardiologist is dr.s.khan. Admitted to hospitalist service for angina started on aspirin beta blockers and monitored on telemetry follows cardiac enzymes. Initial troponin less than 0.03 but following troponins elevated up to 0.87. Seen by Dr. Omelia BlackwaterSicard khan, patient was taken to cardiac catheter this morning. Before the cardiac catheter he was cardioverted. And patient cardiac catheter showed patent bypass graft without any stenosis. Patient right now needs both sotalol and metoprolol for his heart rate control. He is cardioverted with 100 zoules.  1,; chest pain with acute coronary syndrome. [facility]. He had a stress Myoview on Thursday which showed mild inferior wall reversible defect. He had echocardiogram done on Friday which showed ejection fraction 53%. Started on Ranexa, continue aspirin, Xarelto. Chronic atrial fibrillation cardioverted right now he is on sotalol and metoprolol. Continue sotalol at 80 mg twice a day, Toprol-XL 25 mg by mouth daily. And resume xarelto. 3. COPD with  Chronic bronchitis. Continue him on his Advair, he had lots of cough and green phlegm so we gave him Rocephin and Zithromax. I am discharging him with Zithromax. Patient can continue his Combivent and Advair. 4. DM type II he is on now NovoLog and Lantus continue them. Hypertension controlled he is on no on metoprolol tartrate 100 mg daily, Cozaar and HCTZ;continue them. Discharge Condition: stable    Follow UP  Follow-up Information    Follow up with KHAN,SHAUKAT A, MD In 1 week.   Specialty:  Cardiology   Contact information:   [address] [place] [address] [phone]         Discharge Instructions  and  Discharge Medications   Follow up with Dr.Shukat khan in one week     Medication List    TAKE these medications        aspirin 81 MG tablet  Take 81 mg by mouth  daily.     azithromycin 250 MG tablet  Commonly known as:  ZITHROMAX  MAY ADMINISTER WITHOUT REGARD TO MEALS     insulin aspart 100 UNIT/ML injection  Commonly known as:  novoLOG  Inject into the skin 3 (three) times daily before meals.     insulin glargine 100 UNIT/ML injection  Commonly known as:  LANTUS  Inject into the skin at bedtime.     losartan-hydrochlorothiazide 50-12.5 MG per tablet  Commonly known as:  HYZAAR  Take 1 tablet by mouth daily.     metoprolol succinate 25 MG 24 hr tablet  Commonly known as:  TOPROL-XL  Take 1 tablet (25 mg total) by mouth daily.     mometasone 50 MCG/ACT nasal spray  Commonly known as:  NASONEX  Place 2 sprays into the nose daily.     montelukast 10 MG tablet  Commonly known as:  SINGULAIR  Take 10 mg by mouth at bedtime.     multivitamin capsule  Take 1 capsule by mouth daily.     pregabalin 75 MG capsule  Commonly known as:  LYRICA  Take 75 mg by mouth 2 (two) times daily.     ranolazine 500 MG 12 hr tablet  Commonly known as:  RANEXA  Take 1 tablet (500 mg total) by mouth 2 (two) times daily.     rivaroxaban 20 MG Tabs tablet  Commonly known as:  XARELTO  Take 20 mg by mouth 1 day or 1 dose.     saxagliptin HCl 2.5 MG Tabs tablet  Commonly known as:  ONGLYZA  Take 2.5 mg by mouth daily.     sotalol 80 MG tablet  Commonly known as:  BETAPACE  Take 80 mg by mouth 2 (two) times daily.     vitamin C 1000 MG tablet  Take 1,000 mg by mouth daily.          Diet and Activity recommendation: See Discharge Instructions above   Consults obtained - cardiology   Major procedures and Radiology Reports - PLEASE review detailed and final reports for all details, in brief -     [facility]  [facility]  CLINICAL DATA:  Acute onset of chest pain and shortness of breath. Initial encounter.  EXAM: PORTABLE CHEST - 1 VIEW  COMPARISON:  Chest radiograph performed 08/08/2014  FINDINGS: The lungs are mildly  hypoexpanded. Vascular congestion is noted. Mild left basilar airspace opacity may reflect mild pneumonia or possibly asymmetric interstitial edema. No pleural effusion or pneumothorax is seen.  The cardiomediastinal silhouette is mildly enlarged. The patient is status post median sternotomy, with evidence of prior CABG. No acute osseous abnormalities are seen.  IMPRESSION: Lungs mildly hypoexpanded. Vascular congestion and mild cardiomegaly. Mild left basilar airspace opacity may reflect mild pneumonia or possibly asymmetric interstitial edema.   Electronically Signed   By: Roanna Raider M.D.   On: 10/07/2014 00:18    Micro Results    No results found for this or any previous visit (from the past 240 hour(s)).     Today   Subjective:   Virgie Chery today has no chest pain or SOB.  Objective:   Blood pressure 115/72, pulse 74, temperature 97.4 F (36.3 C), temperature source Oral, resp. rate 12, height  (1.803 m), weight 102.331 kg (225 lb 9.6 oz), SpO2 100 %.   Intake/Output Summary (Last 24 hours) at 10/09/14 1043 Last data filed at 10/09/14 0828  Gross per 24 hour  Intake   1340 ml  Output   2500 ml  Net  -1160 ml    Exam Awake Alert, Oriented x 3, No new F.N deficits, Normal affect [place].AT,PERRAL Supple Neck,No JVD, No cervical lymphadenopathy appriciated.  Symmetrical Chest wall movement, Good air movement bilaterally, CTAB RRR,No Gallops,Rubs or new Murmurs, No Parasternal Heave +ve B.Sounds, Abd Soft, Non tender, No organomegaly appriciated, No rebound -guarding or rigidity. No Cyanosis, Clubbing or edema, No new Rash or bruise  Data Review   CBC w Diff: Lab Results  Component Value Date   WBC 9.4 10/06/2014   WBC 12.9* 08/17/2011   HGB 13.5 10/06/2014   HGB 14.0 08/17/2011   HCT 39.1* 10/06/2014   HCT 41.7 08/17/2011   PLT 210 10/06/2014   PLT 187 08/17/2011   LYMPHOPCT 11.9 08/17/2011   MONOPCT 4.3 08/17/2011   EOSPCT 0.0 08/17/2011   BASOPCT 0.1  08/17/2011    CMP: Lab Results  Component Value Date   NA 134* 10/07/2014   NA 137 08/17/2011   K 4.4 10/07/2014   K 4.7 08/17/2011   CL 100* 10/07/2014   CL 101 08/17/2011   CO2 28 10/07/2014   CO2 23 08/17/2011   BUN 21* 10/07/2014   BUN 25* 08/17/2011   CREATININE 1.13 10/07/2014   CREATININE 1.08 08/17/2011   PROT 6.2* 10/07/2014   ALBUMIN 3.4* 10/07/2014   BILITOT 0.4 10/07/2014   ALKPHOS 43 10/07/2014   AST 59* 10/07/2014   ALT 106* 10/07/2014  .   Total Time in preparing paper work, data evaluation and todays exam - 35 minutes  Bucky Grigg M.D on 10/09/2014 at 10:43 AM

## 2014-10-09 NOTE — Anesthesia Procedure Notes (Signed)
Procedure Name: MAC Date/Time: 10/09/2014 9:15 AM Performed by: [name], [name] Pre-anesthesia Checklist: Patient identified, Emergency Drugs available, Suction available, Patient being monitored and Timeout performed Patient Re-evaluated:Patient Re-evaluated prior to inductionOxygen Delivery Method: Nasal cannula Preoxygenation: Pre-oxygenation with 100% oxygen

## 2014-10-09 NOTE — Op Note (Signed)
  NAME:  Thomas BucklerRodney Werner   MRN: [id number] DOB:  Dec 31, 1945   ADMIT DATE: 10/06/2014  Procedure: Electrical Cardioversion Indications:  Atrial Flutter  Procedure Details:  Consent: Risks of procedure as well as the alternatives and risks of each were explained to the (patient/caregiver).  Consent for procedure obtained. and Unable to obtain consent because of altered level of consciousness.  Time Out: Verified patient identification, verified procedure, site/side was marked, verified correct patient position, special equipment/implants available, medications/allergies/relevent history reviewed, required imaging and test results available.  Performed  Patient placed on cardiac monitor, pulse oximetry, supplemental oxygen as necessary.  Sedation given: Short-acting barbiturates Pacer pads placed anterior chest.  Cardioverted 1 time(s).  Cardioverted at 100J.  Evaluation: Findings: Post procedure EKG shows: NSR Complications: none Patient did tolerate procedure well.  Time Spent Directly with the Patient:  1 hour 22minutes   [name] A, M.D. [facility] CenterFACC   10/09/2014 9:25 AM

## 2014-10-09 NOTE — Anesthesia Preprocedure Evaluation (Signed)
Anesthesia Evaluation   Patient awake    Reviewed: Allergy & Precautions, NPO status , Patient's Chart, lab work & pertinent test results, reviewed documented beta blocker date and time   Airway Mallampati: II  TM Distance: >3 FB Neck ROM: Full    Dental   Pulmonary asthma , sleep apnea , COPD   Pulmonary exam normal       Cardiovascular hypertension, + angina + CAD Rhythm:Irregular     Neuro/Psych negative neurological ROS  negative psych ROS   GI/Hepatic Neg liver ROS, GERD-  ,  Endo/Other  diabetes, Well Controlled, Type 2  Renal/GU negative Renal ROS  negative genitourinary   Musculoskeletal negative musculoskeletal ROS (+)   Abdominal Normal abdominal exam  (+)   Peds negative pediatric ROS (+)  Hematology negative hematology ROS (+)   Anesthesia Other Findings   Reproductive/Obstetrics                             Anesthesia Physical Anesthesia Plan  ASA: III  Anesthesia Plan: General   Post-op Pain Management:    Induction: Intravenous  Airway Management Planned: Nasal Cannula  Additional Equipment:   Intra-op Plan:   Post-operative Plan:   Informed Consent: I have reviewed the patients History and Physical, chart, labs and discussed the procedure including the risks, benefits and alternatives for the proposed anesthesia with the patient or authorized representative who has indicated his/her understanding and acceptance.   Dental advisory given  Plan Discussed with: CRNA and Surgeon  Anesthesia Plan Comments:         Anesthesia Quick Evaluation

## 2014-10-09 NOTE — Progress Notes (Signed)
ANTICOAGULATION CONSULT NOTE - Initial Consult  Pharmacy Consult for Rivaroxaban Indication: atrial fibrillation  Allergies  Allergen Reactions  . Ace Inhibitors     Feel terrible.  . Nitrates, Organic     Headache.  . Tetanus Toxoids Swelling    Patient Measurements: Height: 5\' 11"  (180.3 cm) Weight: 225 lb 9.6 oz (102.331 kg) IBW/kg (Calculated) : 75.3   Vital Signs: Temp: 97.4 F (36.3 C) (05/16 0700) Temp Source: Oral (05/16 0700) BP: 115/72 mmHg (05/16 1025) Pulse Rate: 74 (05/16 1025)  Labs:  Recent Labs  10/06/14 2343 10/06/14 2347 10/07/14 0437 10/07/14 1057 10/07/14 1558  HGB 13.5  --   --   --   --   HCT 39.1*  --   --   --   --   PLT 210  --   --   --   --   LABPROT  --  14.6  --   --   --   INR  --  1.12  --   --   --   CREATININE 1.11  --  1.13  --   --   TROPONINI <0.03  --  0.60* 0.87* 0.77*    Estimated Creatinine Clearance: 74.1 mL/min (by C-G formula based on Cr of 1.13).   Medical History: Past Medical History  Diagnosis Date  . Asthma   . COPD (chronic obstructive pulmonary disease)     Early stages  . Diabetes mellitus without complication   . Hypertension   . GERD (gastroesophageal reflux disease)   . Sleep apnea   . Coronary artery disease   . Anginal pain     Medications:  Enoxaparin 105 mg q12h, last dose 5/15 at 1633 PTA rivaroxaban 20 mg daily, last dose 5/13  Goal of Therapy:  Monitor platelets by anticoagulation protocol: Yes   Plan:  Will continue home dose of rivaroxaban 20 mg PO daily with supper.  Will order Hgb for tomorrow AM   Crist FatHannah Doak Mah, PharmD, BCPS Clinical Pharmacist  10/09/2014,10:32 AM

## 2014-10-09 NOTE — Progress Notes (Signed)
Pt. Rested well during the night with C-Pap in place. Fluids continue.  For procedure on Monday. No acute distress observed. No c/o pain. No SOB noted. Will continue to monitor pt. Bilateral groin and left chest and left back preped for procedure. 2nd IV site inserted.  Fall risk bundle in place. Yellow socks on, yellow bracelet in place, fall signage in place. Toileting offered on hourly reounding

## 2014-10-09 NOTE — Progress Notes (Signed)
SUBJECTIVE: had chest pain last night   Filed Vitals:   10/09/14 0441 10/09/14 0700 10/09/14 0820 10/09/14 0852  BP: 114/75 107/73  135/70  Pulse: 79 78  88  Temp: 98.3 F (36.8 C) 97.4 F (36.3 C)    TempSrc:  Oral    Resp: 21     Height:      Weight: 102.331 kg (225 lb 9.6 oz)     SpO2: 97% 96% 96% 97%    Intake/Output Summary (Last 24 hours) at 10/09/14 0922 Last data filed at 10/09/14 0828  Gross per 24 hour  Intake   1340 ml  Output   2500 ml  Net  -1160 ml    LABS: Basic Metabolic Panel:  Recent Labs  96/08/5403/13/16 2343 10/07/14 0437  NA 132* 134*  K 4.3 4.4  CL 99* 100*  CO2 22 28  GLUCOSE 411* 334*  BUN 21* 21*  CREATININE 1.11 1.13  CALCIUM 8.9 8.4*   Liver Function Tests:  Recent Labs  10/06/14 2343 10/07/14 0437  AST 77* 59*  ALT 121* 106*  ALKPHOS 48 43  BILITOT 0.4 0.4  PROT 6.5 6.2*  ALBUMIN 3.7 3.4*   No results for input(s): LIPASE, AMYLASE in the last 72 hours. CBC:  Recent Labs  10/06/14 2343  WBC 9.4  HGB 13.5  HCT 39.1*  MCV 87.4  PLT 210   Cardiac Enzymes:  Recent Labs  10/07/14 0437 10/07/14 1057 10/07/14 1558  TROPONINI 0.60* 0.87* 0.77*   BNP: Invalid input(s): POCBNP D-Dimer: No results for input(s): DDIMER in the last 72 hours. Hemoglobin A1C: No results for input(s): HGBA1C in the last 72 hours. Fasting Lipid Panel: No results for input(s): CHOL, HDL, LDLCALC, TRIG, CHOLHDL, LDLDIRECT in the last 72 hours. Thyroid Function Tests: No results for input(s): TSH, T4TOTAL, T3FREE, THYROIDAB in the last 72 hours.  Invalid input(s): FREET3 Anemia Panel: No results for input(s): VITAMINB12, FOLATE, FERRITIN, TIBC, IRON, RETICCTPCT in the last 72 hours.   PHYSICAL EXAM General: Well developed, well nourished, in no acute distress HEENT:  Normocephalic and atramatic Neck:  No JVD.  Lungs: Clear bilaterally to auscultation and percussion. Heart: HRRR . Normal S1 and S2 without gallops or murmurs.  Abdomen:  Bowel sounds are positive, abdomen soft and non-tender  Msk:  Back normal, normal gait. Normal strength and tone for age. Extremities: No clubbing, cyanosis or edema.   Neuro: Alert and oriented X 3. Psych:  Good affect, responds appropriately  TELEMETRY: sinus rhythm  ASSESSMENT AND PLAN: Unstable angina/s/p CABG, Atrial fib/flutter:  Patient had cardioversion with 100 and converted to NSR from atrial flutter. Now will do cath. Principal Problem:   Chest pain Active Problems:   HTN (hypertension)   DM (diabetes mellitus)   COPD (chronic obstructive pulmonary disease)   Coronary artery disease   Acute chest pain    Thomas Dery A, MD, [facility] 10/09/2014 9:22 AM

## 2014-10-09 NOTE — OR Nursing (Signed)
Dr Welton FlakesKhan notified of patient having 4-10 chest pain.  EKG performed.  MD in to see the patient.  Verbal orders given.  Will continue to monitor.

## 2014-10-09 NOTE — Discharge Instructions (Signed)
Anticoagulation, Generic  Anticoagulants are medicines used to prevent clots from developing in your veins. These medicine are also known as blood thinners. If blood clots are untreated, they could travel to your lungs. This is called a pulmonary embolus. A blood clot in your lungs can be fatal.   Health care providers often use anticoagulants to prevent clots following surgery. Anticoagulants are also used along with aspirin when the heart is not getting enough blood.  Another anticoagulant called warfarin is started 2 to 3 days after a rapid-acting injectable anticoagulant is started. The rapid-acting anticoagulants are usually continued until warfarin has begun to work. Your health care provider will judge this length of time by blood tests known as the prothrombin time (PT) and International Normalization Ratio (INR). This means that your blood is at the necessary and best level to prevent clots.  RISKS AND COMPLICATIONS  · If you have received recent epidural anesthesia, spinal anesthesia, or a spinal tap while receiving anticoagulants, you are at risk for developing a blood clot in or around the spine. This condition could result in long-term or permanent paralysis.  · Because anticoagulants thin your blood, severe bleeding may occur from any tissue or organ. Symptoms of the blood being too thin may include:  ¨ Bleeding from the nose or gums that does not stop quickly.  ¨ Blood in bowel movements which may appear as bright red, dark, or black tarry stools.  ¨ Blood in the urine which may appear as pink, red, or brown urine.  ¨ Unusual bruising or bruising easily.  ¨ A cut that does not stop bleeding within 10 minutes.  ¨ Vomiting blood or continuous nausea for more than 1 day.  ¨ Coughing up blood.  ¨ Broken blood vessels in your eye (subconjunctival hemorrhage).  ¨ Abdominal or back pain with or without flank bruising.  ¨ Sudden, severe headache.  ¨ Sudden weakness or numbness of the face, arm, or leg,  especially on one side of the body.  ¨ Sudden confusion.  ¨ Trouble speaking (aphasia) or understanding.  ¨ Sudden trouble seeing in one or both eyes.  ¨ Sudden trouble walking.  ¨ Dizziness.  ¨ Loss of balance or coordination.  ¨ Vaginal bleeding.  ¨ Swelling or pain at an injection site.  ¨ Superficial fat tissue death (necrosis) which may cause skin scarring. This is more common in women and may first present as pain in the waist, thighs, or buttocks.  ¨ Fever.  · Too little anticoagulation continues to allow the risk for blood clots.  HOME CARE INSTRUCTIONS   · Due to the complications of anticoagulants, it is very important that you take your anticoagulant as directed by your health care provider. Anticoagulants need to be taken exactly as instructed. Be sure you understand all your anticoagulant instructions.  · Keep all follow-up appointments with your health care provider as directed. It is very important to keep your appointments. Not keeping appointments could result in a chronic or permanent injury, pain, or disability.  · Warfarin. Your health care provider will advise you on the length of treatment (usually 3-6 months, sometimes lifelong).  ¨ Take warfarin exactly as directed by your health care provider. It is recommended that you take your warfarin dose at the same time of the day. It is preferred that you take warfarin in the late afternoon. If you have been told to stop taking warfarin, do not resume taking warfarin until directed to do so by your health care   provider. Follow your health care provider's instructions if you accidentally take an extra dose or miss a dose of warfarin. It is very important to take warfarin as directed since bleeding or blood clots could result in chronic or permanent injury, pain, or disability.  ¨ Too much and too little warfarin are both dangerous. Too much warfarin increases the risk of bleeding. Too little warfarin continues to allow the risk for blood clots. While  taking warfarin, you will need to have regular blood tests to measure your blood clotting time. These blood tests usually include both the prothrombin time (PT) and International Normalized Ratio (INR) tests. The PT and INR results allow your health care provider to adjust your dose of warfarin. The dose can change for many reasons. It is critically important that you have your PT and INR levels drawn exactly as directed. Your warfarin dose may stay the same or change depending on what the PT and INR results are. Be sure to follow up with your health care provider regarding your PT and INR test results and what your warfarin dosage should be.  ¨ Many medicines can interfere with warfarin and affect the PT and INR results. You must tell your health care provider about any and all medicines you take, this includes all vitamins and supplements. Ask your health care provider before taking these. Prescription and over-the-counter medicine consistency is critical to warfarin management. It is important that potential interactions are checked before you start a new medicine. Be especially cautious with aspirin and anti-inflammatory medicines. Ask your health care provider before taking these. Medicines such as antibiotics and acid-reducing medicine can interact with warfarin and can cause an increased warfarin effect. Warfarin can also interfere with the effectiveness of medicines you are taking. Do not take or discontinue any prescribed or over-the-counter medicine except on the advice of your health care provider or pharmacist.  ¨ Some vitamins, supplements, and herbal products interfere with the effectiveness of warfarin. Vitamin E may increase the anticoagulant effects of warfarin. Vitamin K may can cause warfarin to be less effective. Do not take or discontinue any vitamin, supplement, or herbal product except on the advice of your health care provider or pharmacist.  ¨ Eat what you normally eat and keep the vitamin K  content of your diet consistent. Avoid major changes in your diet, or notify your health care provider before changing your diet. Suddenly getting a lot more vitamin K could cause your blood to clot too quickly. A sudden decrease in vitamin K intake could cause your blood to clot too slowly. These changes in vitamin K intake could lead to dangerous blood clots or to bleeding. To keep your vitamin K intake consistent, you must be aware of which foods contain moderate or high amounts of vitamin K. Some foods high in vitamin K include spinach, kale, broccoli, cabbage, greens, Brussels sprouts, asparagus, Bok [place]oy, coleslaw, parsley, and green tea. Arrange a visit with a dietitian to answer your questions.  ¨ If you have a loss of appetite or get the stomach flu (viral gastroenteritis), talk to your health care provider as soon as possible. A decrease in your normal vitamin K intake can make you more sensitive to your usual dose of warfarin.  ¨ Some medical conditions may increase your risk for bleeding while you are taking warfarin. A fever, diarrhea lasting more than a day, worsening heart failure, or worsening liver function are some medical conditions that could affect warfarin. Contact your health care provider if   you have any of these medical conditions.  ¨ Alcohol can change the body's ability to handle warfarin. It is best to avoid alcoholic drinks or consume only very small amounts while taking warfarin. Notify your health care provider if you change your alcohol intake. A sudden increase in alcohol use can increase your risk of bleeding. Chronic alcohol use can cause warfarin to be less effective.  · Be careful not to cut yourself when using sharp objects or while shaving.  · Inform all your health care providers and your dentist that you take an anticoagulant.  · Limit physical activities or sports that could result in a fall or cause injury. Avoid contact sports.  · Wear medical alert jewelry or carry a  medical alert card.  SEEK IMMEDIATE MEDICAL CARE IF:  · You cough up blood.  · You have dark or black stools or there is bright red blood coming from your rectum.  · You vomit blood or have nausea for more than 1 day.  · You have blood in the urine or pink colored urine.  · You have unusual bruising or have increased bruising.  · You have bleeding from the nose or gums that does not stop quickly.  · You have a cut that does not stop bleeding within a 2-3 minutes.  · You have sudden weakness or numbness of the face, arm, or leg, especially on one side of the body.  · You have sudden confusion.  · You have trouble speaking (aphasia) or understanding.  · You have sudden trouble seeing in one or both eyes.  · You have sudden trouble walking.  · You have dizziness.  · You have a loss of balance or coordination.  · You have a sudden, severe headache.  · You have a serious fall or head injury, even if you are not bleeding.  · You have swelling or pain at an injection site.  · You have unexplained tenderness or pain in the abdomen, back, waist, thighs or buttocks.  · You have a fever.  Any of these symptoms may represent a serious problem that is an emergency. Do not wait to see if the symptoms will go away. Get medical help right away. Call your local emergency services (911 in [place] Do not drive yourself to the hospital.  Document Released: 05/12/2005 Document Revised: 05/17/2013 Document Reviewed: 12/15/2007  [employer]® Patient Information ©2015 [employer]. This information is not intended to replace advice given to you by your health care provider. Make sure you discuss any questions you have with your health care provider.

## 2014-10-09 NOTE — OR Nursing (Signed)
Pt still report having 4/10 chest pain.  Dr Welton FlakesKhan aware.  Pt transfer to room.  Report given.

## 2014-10-09 NOTE — Progress Notes (Signed)
Noitifed MD Luberta MutterKonidena about repeat troponin at 0.31

## 2014-10-09 NOTE — Care Management (Signed)
Order present for CM assessment for discharge planning.  Patient admitted with chest pain.  Troponins are negative and cardiac cath performed. Medical management.    Patient presents from home and independent in all adls.   Has health insurance.  Denies issues accessing medical care, obtaining medications, maintaining housing, utilities and food.   No discharge needs identified at present time.

## 2014-10-09 NOTE — Progress Notes (Deleted)
SUBJECTIVE: Feeling better   Filed Vitals:   10/08/14 2029 10/08/14 2035 10/09/14 0441 10/09/14 0700  BP:  125/70 114/75 107/73  Pulse:  80 79 78  Temp:  97.7 F (36.5 C) 98.3 F (36.8 C) 97.4 F (36.3 C)  TempSrc:  Oral  Oral  Resp:  20 21   Height:      Weight:   102.331 kg (225 lb 9.6 oz)   SpO2: 98% 100% 97% 96%    Intake/Output Summary (Last 24 hours) at 10/09/14 0845 Last data filed at 10/09/14 0828  Gross per 24 hour  Intake   1340 ml  Output   2500 ml  Net  -1160 ml    LABS: Basic Metabolic Panel:  Recent Labs  21/30/8605/13/16 2343 10/07/14 0437  NA 132* 134*  K 4.3 4.4  CL 99* 100*  CO2 22 28  GLUCOSE 411* 334*  BUN 21* 21*  CREATININE 1.11 1.13  CALCIUM 8.9 8.4*   Liver Function Tests:  Recent Labs  10/06/14 2343 10/07/14 0437  AST 77* 59*  ALT 121* 106*  ALKPHOS 48 43  BILITOT 0.4 0.4  PROT 6.5 6.2*  ALBUMIN 3.7 3.4*   No results for input(s): LIPASE, AMYLASE in the last 72 hours. CBC:  Recent Labs  10/06/14 2343  WBC 9.4  HGB 13.5  HCT 39.1*  MCV 87.4  PLT 210   Cardiac Enzymes:  Recent Labs  10/07/14 0437 10/07/14 1057 10/07/14 1558  TROPONINI 0.60* 0.87* 0.77*   BNP: Invalid input(s): POCBNP D-Dimer: No results for input(s): DDIMER in the last 72 hours. Hemoglobin A1C: No results for input(s): HGBA1C in the last 72 hours. Fasting Lipid Panel: No results for input(s): CHOL, HDL, LDLCALC, TRIG, CHOLHDL, LDLDIRECT in the last 72 hours. Thyroid Function Tests: No results for input(s): TSH, T4TOTAL, T3FREE, THYROIDAB in the last 72 hours.  Invalid input(s): FREET3 Anemia Panel: No results for input(s): VITAMINB12, FOLATE, FERRITIN, TIBC, IRON, RETICCTPCT in the last 72 hours.   PHYSICAL EXAM General: Well developed, well nourished, in no acute distress HEENT:  Normocephalic and atramatic Neck:  No JVD.  Lungs: Clear bilaterally to auscultation and percussion. Heart: HRRR . Normal S1 and S2 without gallops or murmurs.   Abdomen: Bowel sounds are positive, abdomen soft and non-tender  Msk:  Back normal, normal gait. Normal strength and tone for age. Extremities: No clubbing, cyanosis or edema.   Neuro: Alert and oriented X 3. Psych:  Good affect, responds appropriately  TELEMETRY: sinus rhythm  ASSESSMENT AND PLAN: LVEF on echo and cath showed severe LV dysfunction with LVEF of 10 % and severely dilated LV. Normal coronaries. Advise Lifevest and coreg, entresto, ARB/ace inhibitors and spironolactone.  Principal Problem:   Chest pain Active Problems:   HTN (hypertension)   DM (diabetes mellitus)   COPD (chronic obstructive pulmonary disease)   Coronary artery disease   Acute chest pain    [name] A, MD, [facility] 10/09/2014 8:45 AM

## 2014-10-09 NOTE — Transfer of Care (Signed)
Immediate Anesthesia Transfer of Care Note  Patient: [name]  Procedure(s) Performed: Procedure(s): CARDIOVERSION (N/A)  Patient Location: PACU and Cath Lab  Anesthesia Type:MAC  Level of Consciousness: sedated and responds to stimulation  Airway & Oxygen Therapy: Patient Spontanous Breathing and Patient connected to nasal cannula oxygen  Post-op Assessment: Report given to RN and Post -op Vital signs reviewed and stable  Post vital signs: Reviewed and stable  Last Vitals:  Filed Vitals:   10/09/14 0852  BP: 135/70  Pulse: 88  Temp:   Resp:     Complications: No apparent anesthesia complications

## 2014-10-10 DIAGNOSIS — I482 Chronic atrial fibrillation: Secondary | ICD-10-CM | POA: Diagnosis not present

## 2014-10-10 LAB — GLUCOSE, CAPILLARY
Glucose-Capillary: 183 mg/dL — ABNORMAL HIGH (ref 65–99)
Glucose-Capillary: 371 mg/dL — ABNORMAL HIGH (ref 65–99)

## 2014-10-10 MED ORDER — PANTOPRAZOLE SODIUM 40 MG PO TBEC: 40.0000 mg | DELAYED_RELEASE_TABLET | Freq: Every day | ORAL | Status: AC

## 2014-10-10 MED ORDER — PRASUGREL HCL 10 MG PO TABS: 10.0000 mg | ORAL_TABLET | Freq: Every day | ORAL | Status: AC

## 2014-10-10 NOTE — Progress Notes (Signed)
SUBJECTIVE: Patient does states he did had no further chest pain and feels great this morning.   Filed Vitals:   10/09/14 1959 10/09/14 2355 10/10/14 0426 10/10/14 0751  BP: 133/65 143/87 108/58 144/65  Pulse: 84 76 74 65  Temp: 97.4 F (36.3 C) 96.9 F (36.1 C) 96.8 F (36 C) 97.9 F (36.6 C)  TempSrc: Oral Axillary Axillary Oral  Resp: 16 18 18 17   Height:      Weight:   102.694 kg (226 lb 6.4 oz)   SpO2: 98% 96% 97% 100%    Intake/Output Summary (Last 24 hours) at 10/10/14 0841 Last data filed at 10/10/14 0427  Gross per 24 hour  Intake    240 ml  Output   2425 ml  Net  -2185 ml    LABS: Basic Metabolic Panel: No results for input(s): NA, K, CL, CO2, GLUCOSE, BUN, CREATININE, CALCIUM, MG, PHOS in the last 72 hours. Liver Function Tests: No results for input(s): AST, ALT, ALKPHOS, BILITOT, PROT, ALBUMIN in the last 72 hours. No results for input(s): LIPASE, AMYLASE in the last 72 hours. CBC: No results for input(s): WBC, NEUTROABS, HGB, HCT, MCV, PLT in the last 72 hours. Cardiac Enzymes:  Recent Labs  10/09/14 1328 10/09/14 1655 10/09/14 2156  CKMB 0.5  --   --   TROPONINI 0.31* 0.26* 0.26*   BNP: Invalid input(s): POCBNP D-Dimer: No results for input(s): DDIMER in the last 72 hours. Hemoglobin A1C: No results for input(s): HGBA1C in the last 72 hours. Fasting Lipid Panel: No results for input(s): CHOL, HDL, LDLCALC, TRIG, CHOLHDL, LDLDIRECT in the last 72 hours. Thyroid Function Tests: No results for input(s): TSH, T4TOTAL, T3FREE, THYROIDAB in the last 72 hours.  Invalid input(s): FREET3 Anemia Panel: No results for input(s): VITAMINB12, FOLATE, FERRITIN, TIBC, IRON, RETICCTPCT in the last 72 hours.   PHYSICAL EXAM General: Well developed, well nourished, in no acute distress HEENT:  Normocephalic and atramatic Neck:  No JVD.  Lungs: Clear bilaterally to auscultation and percussion. Heart: HRRR . Normal S1 and S2 without gallops or murmurs.   Abdomen: Bowel sounds are positive, abdomen soft and non-tender  Msk:  Back normal, normal gait. Normal strength and tone for age. Extremities: No clubbing, cyanosis or edema.   Neuro: Alert and oriented X 3. Psych:  Good affect, responds appropriately  TELEMETRY: Monitor shows sinus bradycardia about 55 bpm.  ASSESSMENT AND PLAN: Unstable and angina/acute coronary syndrome. Patient has mildly elevated troponin last night. Patient underwent cardiac catheterization yesterday which showed occluded graft to the right coronary artery. Native right coronary artery had diffuse 70% disease from proximal to distally. However it was severely calcified. I had the films reviewed by Dr. Alan Ripperaldwood as well [facility] and they felt that patient should be treated medically. Patient will be discharged today with follow-up in the office next Monday at 3 pm  Principal Problem:   Chest pain Active Problems:   HTN (hypertension)   DM (diabetes mellitus)   COPD (chronic obstructive pulmonary disease)   Coronary artery disease   Acute chest pain    KHAN,SHAUKAT A, MD, [facility] 10/10/2014 8:41 AM

## 2014-10-10 NOTE — Discharge Summary (Signed)
Alonna BucklerRodney Deweese, is a 69 y.o. male  DOB 10/07/1945  MRN [id number].  Admission date:  10/06/2014  Admitting Physician  Crissie FiguresEdavally N Reddy, MD  Discharge Date:  10/10/2014   Primary MD  No PCP Per Patient  Recommendations for primary care physician for things to follow: Follow-up with cardiology Dr. Welton FlakesKhan in one week.   Admission Diagnosis  Hyperglycemia [R73.9] Atrial fibrillation and flutter [I48.91, I48.92] Chest pain, unspecified chest pain type [R07.9]   Discharge Diagnosis  Hyperglycemia [R73.9] Atrial fibrillation and flutter [I48.91, I48.92] Chest pain, unspecified chest pain type [R07.9]    Principal Problem:   Chest pain Active Problems:   HTN (hypertension)   DM (diabetes mellitus)   COPD (chronic obstructive pulmonary disease)   Coronary artery disease   Acute chest pain      Past Medical History  Diagnosis Date  . Asthma   . COPD (chronic obstructive pulmonary disease)     Early stages  . Diabetes mellitus without complication   . Hypertension   . GERD (gastroesophageal reflux disease)   . Sleep apnea   . Coronary artery disease   . Anginal pain     Past Surgical History  Procedure Laterality Date  . Quadruple bypass    . Hernia repair    . Coronary artery bypass graft    . Cardiac catheterization Left 10/09/2014    Procedure: Left Heart Cath;  Surgeon: Laurier NancyShaukat A Khan, MD;  Location: [facility] INVASIVE CV [facility];  Service: Cardiovascular;  Laterality: Left;   Discharge was held yesterday  secondary to ongoing chest pain. Patient started on Effient, Protonix. This morning patient denies any chest pain he feels much better and he wants to go home. We will add Protonix and also Effient to his home medication can stop Xarelto. Patient will see Dr. Levonne Huberthicago Khan in one week.    History of present illness and   Hospital Course:     Kindly see H&P for history of present illness and admission details, please review complete Labs, Consult reports and Test reports for all details in brief  HPI  from the history and physical done on the day of admission    Hospital Course 69 year old male male patient with history of COPD, CAD status post CABG, type 2 diabetes mellitus, hyperlipidemia, sleep apnea, chronic atrial fibrillation on Xarelto comes in because of left-sided chest pain going to the left arm.. Patient found to have a sinus tachycardia at 1 42 bpm on admission. Patient's cardiologist is dr.s.khan. Admitted to hospitalist service for angina started  on aspirin beta blockers and monitored on telemetry follows cardiac enzymes. Initial troponin less than 0.03 but following troponins elevated up to 0.87. Seen by Dr. Omelia Blackwater, patient was taken to cardiac catheter this morning. Before the cardiac catheter he was cardioverted. And patient cardiac catheter showed patent bypass graft without any stenosis. Patient right now needs both sotalol and metoprolol for his heart rate control. He is cardioverted with 100 zoules.  1,; chest pain with acute coronary syndrome. History of CABG at [facility]. He had a stress Myoview on Thursday which showed mild inferior wall reversible defect. He had echocardiogram done on Friday which showed ejection fraction 53%. Started on Ranexa, continue aspirin, Xarelto. Chronic atrial fibrillation cardioverted right now he is on sotalol and metoprolol. Continue sotalol at 80 mg twice a day, Toprol-XL 25 mg by mouth daily. And resume xarelto. 3. COPD with  Chronic bronchitis. Continue him on his Advair, he had lots of cough and green phlegm so we gave him Rocephin and Zithromax. I am discharging him with Zithromax. Patient can continue his Combivent and Advair. 4. DM type II he is on now NovoLog and Lantus continue them. Hypertension controlled he is on no on metoprolol  tartrate 100 mg daily, Cozaar and HCTZ;continue them. Discharge Condition: stable    Follow UP      Follow-up Information    Follow up with Laurier Nancy, MD.   Specialty:  Cardiology   Why:  Wednesday, May 25th at [facility]   Contact information:   [address] [place] [address] [phone]       Follow up with Luna Fuse, MD.   Specialty:  Internal Medicine   Why:  Wednesday, May 25th at 315pm, [facility]   Contact information:   [address] [place] [address] [phone]         Discharge Instructions  and  Discharge Medications   Follow up with Dr.Shukat khan in one week     Medication List    TAKE these medications        aspirin 81 MG tablet  Take 81 mg by mouth daily.     azithromycin 250 MG tablet  Commonly known as:  ZITHROMAX  MAY ADMINISTER WITHOUT REGARD TO MEALS     insulin aspart 100 UNIT/ML injection  Commonly known as:  novoLOG  Inject into the skin 3 (three) times daily before meals.     insulin glargine 100 UNIT/ML injection  Commonly known as:  LANTUS  Inject into the skin at bedtime.     losartan-hydrochlorothiazide 50-12.5 MG per tablet  Commonly known as:  HYZAAR  Take 1 tablet by mouth daily.     metoprolol succinate 25 MG 24 hr tablet  Commonly known as:  TOPROL-XL  Take 1 tablet (25 mg total) by mouth daily.     mometasone 50 MCG/ACT nasal spray  Commonly known as:  NASONEX  Place 2 sprays into the nose daily.     montelukast 10 MG tablet  Commonly known as:  SINGULAIR  Take 10 mg by mouth at bedtime.     multivitamin capsule  Take 1 capsule by mouth daily.     pregabalin 75 MG capsule  Commonly known as:  LYRICA  Take 75 mg by mouth 2 (two) times daily.     ranolazine 500 MG 12 hr tablet  Commonly known as:  RANEXA  Take 1 tablet (500 mg total) by mouth 2 (two) times daily.     rivaroxaban  20 MG Tabs tablet  Commonly known as:  XARELTO  Take 20 mg by mouth 1 day or 1 dose.      saxagliptin HCl 2.5 MG Tabs tablet  Commonly known as:  ONGLYZA  Take 2.5 mg by mouth daily.     sotalol 80 MG tablet  Commonly known as:  BETAPACE  Take 80 mg by mouth 2 (two) times daily.     vitamin C 1000 MG tablet  Take 1,000 mg by mouth daily.          Diet and Activity recommendation: See Discharge Instructions above   Consults obtained - cardiology   Major procedures and Radiology Reports - PLEASE review detailed and final reports for all details, in brief -     Dg Chest Port 1 View  10/07/2014   CLINICAL DATA:  Acute onset of chest pain and shortness of breath. Initial encounter.  EXAM: PORTABLE CHEST - 1 VIEW  COMPARISON:  Chest radiograph performed 08/08/2014  FINDINGS: The lungs are mildly hypoexpanded. Vascular congestion is noted. Mild left basilar airspace opacity may reflect mild pneumonia or possibly asymmetric interstitial edema. No pleural effusion or pneumothorax is seen.  The cardiomediastinal silhouette is mildly enlarged. The patient is status post median sternotomy, with evidence of prior CABG. No acute osseous abnormalities are seen.  IMPRESSION: Lungs mildly hypoexpanded. Vascular congestion and mild cardiomegaly. Mild left basilar airspace opacity may reflect mild pneumonia or possibly asymmetric interstitial edema.   Electronically Signed   By: Roanna Raider M.D.   On: 10/07/2014 00:18    Micro Results    No results found for this or any previous visit (from the past 240 hour(s)).     Today   Subjective:   Chrystian Cupples today has no chest pain or SOB.  Objective:   Blood pressure 144/65, pulse 65, temperature 97.9 F (36.6 C), temperature source Oral, resp. rate 17, height  (1.803 m), weight 102.694 kg (226 lb 6.4 oz), SpO2 100 %.   Intake/Output Summary (Last 24 hours) at 10/10/14 1033 Last data filed at 10/10/14 0825  Gross per 24 hour  Intake    480 ml  Output   2975 ml  Net  -2495 ml    Exam Awake Alert, Oriented x 3, No  new F.N deficits, Normal affect [place].AT,PERRAL Supple Neck,No JVD, No cervical lymphadenopathy appriciated.  Symmetrical Chest wall movement, Good air movement bilaterally, CTAB RRR,No Gallops,Rubs or new Murmurs, No Parasternal Heave +ve B.Sounds, Abd Soft, Non tender, No organomegaly appriciated, No rebound -guarding or rigidity. No Cyanosis, Clubbing or edema, No new Rash or bruise  Data Review   CBC w Diff:  Lab Results  Component Value Date   WBC 9.4 10/06/2014   WBC 12.9* 08/17/2011   HGB 13.5 10/06/2014   HGB 14.0 08/17/2011   HCT 39.1* 10/06/2014   HCT 41.7 08/17/2011   PLT 210 10/06/2014   PLT 187 08/17/2011   LYMPHOPCT 11.9 08/17/2011   MONOPCT 4.3 08/17/2011   EOSPCT 0.0 08/17/2011   BASOPCT 0.1 08/17/2011    CMP:  Lab Results  Component Value Date   NA 134* 10/07/2014   NA 137 08/17/2011   K 4.4 10/07/2014   K 4.7 08/17/2011   CL 100* 10/07/2014   CL 101 08/17/2011   CO2 28 10/07/2014   CO2 23 08/17/2011   BUN 21* 10/07/2014   BUN 25* 08/17/2011   CREATININE 1.13 10/07/2014   CREATININE 1.08 08/17/2011   PROT 6.2* 10/07/2014  ALBUMIN 3.4* 10/07/2014   BILITOT 0.4 10/07/2014   ALKPHOS 43 10/07/2014   AST 59* 10/07/2014   ALT 106* 10/07/2014  .   Total Time in preparing paper work, data evaluation and todays exam - 35 minutes  Zeenat Jeanbaptiste M.D on 10/10/2014 at 10:33 AM

## 2015-02-02 ENCOUNTER — Encounter: Payer: Self-pay | Admitting: Cardiovascular Disease

## 2015-03-26 ENCOUNTER — Encounter: Payer: Self-pay | Admitting: Dietician

## 2015-03-26 ENCOUNTER — Encounter: Payer: Medicare Other | Attending: Internal Medicine | Admitting: Dietician

## 2015-03-26 VITALS — BP 126/66 | Ht 71.0 in | Wt 231.5 lb

## 2015-03-26 DIAGNOSIS — E119 Type 2 diabetes mellitus without complications: Secondary | ICD-10-CM | POA: Insufficient documentation

## 2015-03-26 DIAGNOSIS — Z794 Long term (current) use of insulin: Secondary | ICD-10-CM

## 2015-03-26 NOTE — Progress Notes (Signed)
Diabetes Self-Management Education  Visit Type: First/Initial  Appt. Start Time: 0915 Appt. End Time: 1030  03/26/2015  [name], identified by name and date of birth, is a 69 y.o. male with a diagnosis of Diabetes: Type 2.   ASSESSMENT  Blood pressure 126/66, height 5\' 11"  (1.803 m), weight 231 lb 8 oz (105.008 kg). Body mass index is 32.3 kg/(m^2). c/o constant numbness and pain in feet      Diabetes Self-Management Education - 03/26/15 1256    Visit Information   Visit Type First/Initial   Initial Visit   Diabetes Type Type 2   Health Coping   How would you rate your overall health? Fair   Psychosocial Assessment   Patient Belief/Attitude about Diabetes Motivated to manage diabetes   Self-care barriers Hard of hearing  wears hearing aides both ears   Patient Concerns Glycemic Control;Weight Control;Healthy Lifestyle;Medication   Special Needs None   Preferred Learning Style Visual   Learning Readiness Ready   Complications   Last HgB A1C per patient/outside source 8 %  03-12-15   How often do you check your blood sugar? --  1x/day-Fasting   Fasting Blood glucose range (mg/dL) --  FBG's 16'X-096'E90's-150's   Have you had a dilated eye exam in the past 12 months? Yes   Have you had a dental exam in the past 12 months? Yes   Are you checking your feet? Yes   How many days per week are you checking your feet? 7   Dietary Intake   Breakfast --  8-9am but occasionally skips breakfast   Lunch --  eats lunch at 1pm but occasionally skips; eats fried foods and sweets/desserts 2-3x/wk; eats snack foods 4-5x/wk   Dinner --  eats at [employer]-6pm   Snack (evening) --  eats snack at 8-10pm: fruit, dill pickle , olives or snack crackers   Beverage(s) --  drinks 4-5 glasses of water/day and 2-3 glasses of unsweetened beverages/day   Exercise   Exercise Type --  walks 30 min 1-3x/wk   Patient Education   Previous Diabetes Education Yes (please comment)  at [facility]   Disease state   Explored patient's options for treatment of their diabetes  discussed pathophysiology of type 2 diabetes   Nutrition management  Role of diet in the treatment of diabetes and the relationship between the three main macronutrients and blood glucose level;Food label reading, portion sizes and measuring food.;Carbohydrate counting;Meal timing in regards to the patients' current diabetes medication.   Physical activity and exercise  Role of exercise on diabetes management, blood pressure control and cardiac health.;Helped patient identify appropriate exercises in relation to his/her diabetes, diabetes complications and other health issue.   Medications Taught/reviewed insulin injection, site rotation, insulin storage and needle disposal.;Reviewed patients medication for diabetes, action, purpose, timing of dose and side effects.   Monitoring Purpose and frequency of SMBG.;Yearly dilated eye exam;Taught/discussed recording of test results and interpretation of SMBG.;Identified appropriate SMBG and/or A1C goals.   Acute complications Taught treatment of hypoglycemia - the 15 rule.;Discussed and identified patients' treatment of hyperglycemia.   Chronic complications Relationship between chronic complications and blood glucose control;Dental care;Identified and discussed with patient  current chronic complications;Assessed and discussed foot care and prevention of foot problems   Personal strategies to promote health Lifestyle issues that need to be addressed for better diabetes care      Individualized Plan for Diabetes Self-Management Training:   Learning Objective:  Patient will have a greater understanding of diabetes  self-management. Patient education plan is to attend individual and/or group sessions per assessed needs and concerns.   Plan:   Patient Instructions   Check blood sugars 4 x day before each meal and before bed every day Exercise:  walk for    30  minutes  3-4  days a week Avoid  sugar sweetened drinks (soda, tea, coffee, sports drinks, juices)-limit intake of sweets and fried foods Eat 3 meals day,   1 small snack a day at bedtime Space meals 4-5 hours apart Complete 3 Day Food Record and bring to next appt Bring blood sugar records to the next appointment/class Get a Sharps container Carry fast acting glucose and a snack at all times Rotate injection sites-tale pen needle off pen after each injection Space Lantus 11-12 hours apart Take Novolog before each meal per sliding scale as directed by MD Ask MD about switching Lantus to [employer] if questions [phone] Return for appointment/classes on:  03-29-15  Expected Outcomes:     Education material provided: General Meal Planning Guidelines  If problems or questions, patient to contact team via:  [phone]  Future DSME appointment:  03-29-15

## 2015-03-26 NOTE — Progress Notes (Unsigned)
Subjective:     Patient ID: Thomas BucklerRodney Prom, male   DOB: 1946/01/17, 69 y.o.   MRN: [id number]  HPI   Review of Systems     Objective:   Physical Exam     Assessment:     ***    Plan:     ***

## 2015-03-26 NOTE — Progress Notes (Signed)
Pt reports unable to come to class 3 on 04-12-15-rescheduled class 3 on 05-10-15

## 2015-03-26 NOTE — Patient Instructions (Signed)
  Check blood sugars 4 x day before each meal and before bed every day Exercise:  walk for    30  minutes  3-4  days a week Avoid sugar sweetened drinks (soda, tea, coffee, sports drinks, juices)-limit intake of sweets and fried foods Eat 3 meals day,   1 small snack a day at bedtime Space meals 4-5 hours apart Complete 3 Day Food Record and bring to next appt Bring blood sugar records to the next appointment/class Get a Sharps container Carry fast acting glucose and a snack at all times Rotate injection sites-tale pen needle off pen after each injection Space Lantus 11-12 hours apart Take Novolog before each meal per sliding scale as directed by MD Call if questions [phone] Return for appointment/classes on:  03-29-15

## 2015-03-29 ENCOUNTER — Encounter: Payer: Medicare Other | Attending: Internal Medicine | Admitting: Dietician

## 2015-03-29 ENCOUNTER — Encounter: Payer: Self-pay | Admitting: Dietician

## 2015-03-29 VITALS — Ht 71.0 in | Wt 232.0 lb

## 2015-03-29 DIAGNOSIS — E119 Type 2 diabetes mellitus without complications: Secondary | ICD-10-CM | POA: Insufficient documentation

## 2015-03-29 DIAGNOSIS — Z794 Long term (current) use of insulin: Secondary | ICD-10-CM

## 2015-03-29 NOTE — Progress Notes (Signed)

## 2015-04-05 ENCOUNTER — Encounter: Payer: Self-pay | Admitting: *Deleted

## 2015-04-05 ENCOUNTER — Encounter: Payer: Medicare Other | Admitting: *Deleted

## 2015-04-05 VITALS — Wt 230.1 lb

## 2015-04-05 DIAGNOSIS — Z794 Long term (current) use of insulin: Principal | ICD-10-CM

## 2015-04-05 DIAGNOSIS — E119 Type 2 diabetes mellitus without complications: Secondary | ICD-10-CM

## 2015-04-05 NOTE — Progress Notes (Signed)

## 2015-05-10 ENCOUNTER — Encounter: Payer: Medicare Other | Attending: Internal Medicine | Admitting: Dietician

## 2015-05-10 VITALS — BP 150/70 | Ht 71.0 in | Wt 233.6 lb

## 2015-05-10 DIAGNOSIS — E119 Type 2 diabetes mellitus without complications: Secondary | ICD-10-CM | POA: Diagnosis present

## 2015-05-10 DIAGNOSIS — Z794 Long term (current) use of insulin: Secondary | ICD-10-CM

## 2015-05-10 NOTE — Progress Notes (Signed)
Appt. Start Time: 0900 Appt. End Time: 1200  Class 3 Psychosocial - identify DM as a source of stress; state the effects of stress on BG control; verbalize appropriate stress management techniques; identify personal stress issues   Nutritional Management - describe effects of food on blood glucose; identify sources of carbohydrate, protein and fat; verbalize the importance of balance meals in controlling blood glucose; identify meals as well balanced or not; estimate servings of carbohydrate from menus; use food labels to identify servings size, content of carbohydrate, fiber, protein, fat, saturated fat and sodium; recognize food sources of fat, saturated fat, trans fat, sodium and verbalize goals for intake; describe healthful appropriate food choices when dining out   Exercise - describe the effects of exercise on blood glucose and importance of regular exercise in controlling diabetes; state a plan for personal exercise; verbalize contraindications for exercise  Self-Monitoring - state importance of HBGM and demo procedure accurately; use HBGM results to effectively manage diabetes; identify importance of regular HbA1C testing and goals for results  Acute Complications/Sick Day Guidelines - recognize hyperglycemia and hypoglycemia with causes and effects; identify blood glucose results as high, low or in control; list steps in treating and preventing high and low blood glucose; state appropriate measure to manage blood glucose when ill (need for meds, HBGM plan, when to call physician, need for fluids)  Chronic Complications/Foot, Skin, Eye Dental Care - identify possible long-term complications of diabetes (retinopathy, neuropathy, nephropathy, cardiovascular disease, infections); explain steps in prevention and treatment of chronic complications; state importance of daily self-foot exams; describe how to examine feet and what to look for; explain appropriate eye and dental care  Lifestyle  Changes/Goals & Health/Community Resources - state benefits of making appropriate lifestyle changes; identify habits that need to change (meals, tobacco, alcohol); identify strategies to reduce risk factors for personal health; set goals for proper diabetes care; state need for and frequency of healthcare follow-up; describe appropriate community resources for good health (ADA, web sites, apps)   Teaching Materials Used: Class 3 Slide Packet Diabetes Stress Test Stress Management Tools Stress Poem Recipe/Menu Booklet Nutrition Prescription Fast Food Information Goal Setting Worksheet    3

## 2015-05-14 ENCOUNTER — Encounter: Payer: Self-pay | Admitting: Dietician

## 2015-06-12 DIAGNOSIS — G4730 Sleep apnea, unspecified: Secondary | ICD-10-CM | POA: Insufficient documentation

## 2015-06-12 DIAGNOSIS — Z9114 Patient's other noncompliance with medication regimen: Secondary | ICD-10-CM | POA: Insufficient documentation

## 2015-07-23 DIAGNOSIS — K760 Fatty (change of) liver, not elsewhere classified: Secondary | ICD-10-CM | POA: Insufficient documentation

## 2015-08-02 DIAGNOSIS — T82857A Stenosis of cardiac prosthetic devices, implants and grafts, initial encounter: Secondary | ICD-10-CM | POA: Insufficient documentation

## 2015-08-02 DIAGNOSIS — R42 Dizziness and giddiness: Secondary | ICD-10-CM | POA: Insufficient documentation

## 2015-08-14 ENCOUNTER — Other Ambulatory Visit: Payer: Self-pay

## 2015-08-14 DIAGNOSIS — I10 Essential (primary) hypertension: Secondary | ICD-10-CM | POA: Insufficient documentation

## 2015-08-16 ENCOUNTER — Ambulatory Visit (INDEPENDENT_AMBULATORY_CARE_PROVIDER_SITE_OTHER): Payer: Medicare Other | Admitting: General Surgery

## 2015-08-16 ENCOUNTER — Other Ambulatory Visit: Payer: Self-pay

## 2015-08-16 ENCOUNTER — Encounter: Payer: Self-pay | Admitting: General Surgery

## 2015-08-16 VITALS — BP 175/77 | HR 86 | Temp 98.2°F | Ht 71.0 in | Wt 233.6 lb

## 2015-08-16 DIAGNOSIS — K8020 Calculus of gallbladder without cholecystitis without obstruction: Secondary | ICD-10-CM

## 2015-08-16 NOTE — Patient Instructions (Addendum)
You may use Miralax 17 grams daily as needed. You get this over the counter.  If you develop any pain in your Right Upper Abdomen or radiating pain to your back or Nausea after eating, please call our office and we will work you in.

## 2015-08-17 DIAGNOSIS — K8020 Calculus of gallbladder without cholecystitis without obstruction: Secondary | ICD-10-CM | POA: Insufficient documentation

## 2015-08-17 NOTE — Progress Notes (Signed)
Patient ID: [name], male   DOB: [birth year], 70 y.o.   MRN: [id number]  CC: abnormal liver function tests  HPI [name] is a 70 y.o. male who presents to surgical asked a for evaluation of gallstones. Patient states that he has been undergoing workup for abnormal liver function and had an ultrasound which showed gallstones. Upon questioning the patient he denies any history of abdominal pain. Patient was unsure why he was sent for surgery. He denies any fevers, chills, chest pain, shortness of breath, diarrhea, constipation, any abdominal pain. He has never had right upper quadrant abdominal pain. He is otherwise in his usual state of health and his primary problem he states that he needs to lose weight. While he was here he also pointed out a bulge in his anterior abdomen that ran from below his xiphoid to his umbilicus. He states it is not tender that he's been curious as to what it is.  HPI  Past Medical History  Diagnosis Date  . Asthma   . COPD (chronic obstructive pulmonary disease) (HCC)     Early stages  . Diabetes mellitus without complication (HCC)   . Hypertension   . GERD (gastroesophageal reflux disease)   . Sleep apnea   . Coronary artery disease   . Anginal pain (HCC)   . Hyperlipidemia     Past Surgical History  Procedure Laterality Date  . Quadruple bypass    . Coronary artery bypass graft    . Cardiac catheterization Left 10/09/2014    Procedure: Left Heart Cath;  Surgeon: Laurier Nancy, MD;  Location: [facility] INVASIVE CV LAB;  Service: Cardiovascular;  Laterality: Left;  . Electrophysiologic study N/A 10/09/2014    Procedure: CARDIOVERSION;  Surgeon: Laurier Nancy, MD;  Location: [facility] ORS;  Service: Cardiovascular;  Laterality: N/A;  . Hernia repair Left 1982    Inguinal  . Hernia repair  1982 and 1990    Umbilical    Family History  Problem Relation Age of Onset  . Diabetes Mother   . Hypertension Mother   . COPD Father     Social History Social  History  Substance Use Topics  . Smoking status: Never Smoker   . Smokeless tobacco: Never Used  . Alcohol Use: No    Allergies  Allergen Reactions  . Ace Inhibitors     Feel terrible.  . Amlodipine     Other reaction(s): cramping  . Doxazosin Swelling  . Nitrates, Organic     Headache.  . Statins Swelling  . Tetanus Toxoids Swelling    Current Outpatient Prescriptions  Medication Sig Dispense Refill  . albuterol (PROAIR HFA) 108 (90 BASE) MCG/ACT inhaler Inhale 2 puffs into the lungs every 6 (six) hours as needed. As needed    . Alirocumab (PRALUENT) 75 MG/ML SOSY Inject into the skin. 2 times/month    . Ascorbic Acid (VITAMIN C) 1000 MG tablet Take 1,000 mg by mouth daily.    Marland Kitchen aspirin 81 MG tablet Take 81 mg by mouth daily.    Marland Kitchen BREO ELLIPTA 100-25 MCG/INH AEPB Inhale 1 puff into the lungs daily.  0  . Cholecalciferol (VITAMIN D3) 1000 UNITS CAPS Take 1 capsule by mouth daily.    . Coenzyme Q10 (CO Q 10 PO) Take 400 mg by mouth daily.    . colesevelam (WELCHOL) 625 MG tablet Take 3 tablets by mouth 2 (two) times daily.    . INCRUSE ELLIPTA 62.5 MCG/INH AEPB Take 1 puff by mouth daily.  0  . insulin aspart (NOVOLOG) 100 UNIT/ML injection Inject into the skin 3 (three) times daily before meals. Per sliding scale: if BG 150 or below=5 units; take additional  5 units/50 for BG>150    . insulin glargine (LANTUS) 100 UNIT/ML injection Inject 95 Units into the skin 2 (two) times daily.     Marland Kitchen. LANTUS SOLOSTAR 100 UNIT/ML Solostar Pen Take 95 Units by mouth 2 (two) times daily.  0  . losartan (COZAAR) 25 MG tablet Take 1 tablet by mouth daily.    Marland Kitchen. losartan-hydrochlorothiazide (HYZAAR) 50-12.5 MG per tablet Take 0.5 tablets by mouth daily.     Marland Kitchen. LYRICA 150 MG capsule Take 1 tablet by mouth 2 (two) times daily.   0  . Magnesium Citrate 100 MG TABS Take 1 tablet by mouth daily.    . meclizine (ANTIVERT) 25 MG tablet Take 1 tablet by mouth 3 (three) times daily as needed.     Marland Kitchen. MEGARED  OMEGA-3 KRILL OIL PO Take 1 capsule by mouth daily.     . metoprolol succinate (TOPROL-XL) 25 MG 24 hr tablet Take 1 tablet (25 mg total) by mouth daily. 30 tablet 0  . mometasone (NASONEX) 50 MCG/ACT nasal spray Place 2 sprays into the nose daily as needed.     . montelukast (SINGULAIR) 10 MG tablet Take 10 mg by mouth at bedtime.    . Multiple Vitamin (MULTIVITAMIN) capsule Take 1 capsule by mouth daily.    Marland Kitchen. NOVOLOG FLEXPEN 100 UNIT/ML FlexPen Inject into the skin daily. Sliding Scale  0  . pantoprazole (PROTONIX) 40 MG tablet Take 1 tablet (40 mg total) by mouth daily. (Patient taking differently: Take 40 mg by mouth 2 (two) times daily. ) 30 tablet 0  . prasugrel (EFFIENT) 10 MG TABS tablet Take 1 tablet (10 mg total) by mouth daily. 30 tablet 0  . pregabalin (LYRICA) 75 MG capsule Take 75 mg by mouth 2 (two) times daily.    Marland Kitchen. RA VITAMIN D-3 1000 units tablet Take 1 tablet by mouth daily.  0  . RANEXA 1000 MG SR tablet Take 1 tablet by mouth 2 (two) times daily.  0  . sotalol (BETAPACE) 80 MG tablet Take 80 mg by mouth 2 (two) times daily.    . TURMERIC PO Take 1 tablet by mouth daily.     No current facility-administered medications for this visit.     Review of Systems A Multi-point review of systems was asked and was negative except for the findings documented in the history of present illness  Physical Exam Blood pressure 175/77, pulse 86, temperature 98.2 F (36.8 C), temperature source Oral, height 5\' 11"  (1.803 m), weight 105.96 kg (233 lb 9.6 oz). CONSTITUTIONAL: No acute distress. EYES: Pupils are equal, round, and reactive to light, Sclera are non-icteric. EARS, NOSE, MOUTH AND THROAT: The oropharynx is clear. The oral mucosa is pink and moist. Hearing is intact to voice. LYMPH NODES:  Lymph nodes in the neck are normal. RESPIRATORY:  Lungs are clear. There is normal respiratory effort, with equal breath sounds bilaterally, and without pathologic use of accessory  muscles. CARDIOVASCULAR: Heart is regular without murmurs, gallops, or rubs. GI: The abdomen is soft, nontender, and nondistended. There are no palpable masses. There is no hepatosplenomegaly. There are normal bowel sounds in all quadrants. There is a visible diastases recti from the umbilicus to the xiphoid GU: Rectal deferred.   MUSCULOSKELETAL: Normal muscle strength and tone. No cyanosis or edema.  SKIN: Turgor is good and there are no pathologic skin lesions or ulcers. NEUROLOGIC: Motor and sensation is grossly normal. Cranial nerves are grossly intact. PSYCH:  Oriented to person, place and time. Affect is normal.  Data Reviewed Patient's most recent LFTs on her from May 2016. They show a normal alkaline phosphatase of 43, a mildly elevated AST of 59 and ALT of 106, a normal total bilirubin 0.4. His ultrasound was performed on March 7 at an outside facility. The report is scanned into the system under media which shows cholelithiasis, sludge, no evidence of cholecystitis I have personally reviewed the patient's imaging, laboratory findings and medical records.    Assessment    70 year old male with completely asymptomatic cholelithiasis    Plan    A long conversation with the patient about the signs and symptoms of cholelithiasis. Discussed that the majority of patients have gallstones without ever having any symptoms. Discussed that should he develop any postprandial right upper quadrant pain that he would not require additional images and less the pain did not go away. Discussed that pain is right upper quadrant that does not go away is not normal and that he should seek medical care immediately as this could represent cholecystitis. Also counseled that the risk of this is very rare. Patient also has a diastases recti. Discussed with patient that it is not a hernia does not carry any of the risks of a hernia. A primary treatment for this for him would be weight loss and patient voiced  understanding. All questions were answered to the patient and his spouse is satisfaction and he will follow up with clinic on an as-needed basis.     Time spent with the patient was 60 minutes, with more than 50% of the time spent in face-to-face education, counseling and care coordination.     Ricarda Frame, MD FACS General Surgeon 08/17/2015, 3:00 PM

## 2015-08-31 ENCOUNTER — Other Ambulatory Visit: Payer: Self-pay | Admitting: Otolaryngology

## 2015-08-31 DIAGNOSIS — R42 Dizziness and giddiness: Secondary | ICD-10-CM

## 2015-09-20 ENCOUNTER — Ambulatory Visit
Admission: RE | Admit: 2015-09-20 | Discharge: 2015-09-20 | Disposition: A | Discharge status: Home / Self Care | Payer: Medicare Other | Source: Ambulatory Visit | Attending: Otolaryngology | Admitting: Otolaryngology

## 2015-09-20 DIAGNOSIS — R42 Dizziness and giddiness: Secondary | ICD-10-CM | POA: Diagnosis not present

## 2015-09-20 LAB — POCT I-STAT CREATININE: Creatinine, Ser: 1 mg/dL (ref 0.61–1.24)

## 2015-09-20 MED ORDER — GADOBENATE DIMEGLUMINE 529 MG/ML IV SOLN
20.0000 mL | Freq: Once | INTRAVENOUS | Status: AC | PRN
  Administered 2015-09-20: 20 mL via INTRAVENOUS

## 2016-04-02 ENCOUNTER — Other Ambulatory Visit: Payer: Self-pay | Admitting: Orthopedic Surgery

## 2016-04-15 ENCOUNTER — Encounter
Admission: RE | Admit: 2016-04-15 | Discharge: 2016-04-15 | Disposition: A | Discharge status: Home / Self Care | Payer: Medicare Other | Source: Ambulatory Visit | Attending: Orthopedic Surgery | Admitting: Orthopedic Surgery

## 2016-04-15 DIAGNOSIS — G4730 Sleep apnea, unspecified: Secondary | ICD-10-CM | POA: Diagnosis not present

## 2016-04-15 DIAGNOSIS — I10 Essential (primary) hypertension: Secondary | ICD-10-CM | POA: Insufficient documentation

## 2016-04-15 DIAGNOSIS — Z01810 Encounter for preprocedural cardiovascular examination: Secondary | ICD-10-CM | POA: Insufficient documentation

## 2016-04-15 DIAGNOSIS — E785 Hyperlipidemia, unspecified: Secondary | ICD-10-CM | POA: Insufficient documentation

## 2016-04-15 DIAGNOSIS — E119 Type 2 diabetes mellitus without complications: Secondary | ICD-10-CM | POA: Insufficient documentation

## 2016-04-15 DIAGNOSIS — I209 Angina pectoris, unspecified: Secondary | ICD-10-CM | POA: Insufficient documentation

## 2016-04-15 DIAGNOSIS — I2510 Atherosclerotic heart disease of native coronary artery without angina pectoris: Secondary | ICD-10-CM | POA: Diagnosis not present

## 2016-04-15 DIAGNOSIS — K219 Gastro-esophageal reflux disease without esophagitis: Secondary | ICD-10-CM | POA: Insufficient documentation

## 2016-04-15 DIAGNOSIS — Q248 Other specified congenital malformations of heart: Secondary | ICD-10-CM | POA: Diagnosis not present

## 2016-04-15 DIAGNOSIS — R9431 Abnormal electrocardiogram [ECG] [EKG]: Secondary | ICD-10-CM | POA: Diagnosis not present

## 2016-04-15 DIAGNOSIS — Z01812 Encounter for preprocedural laboratory examination: Secondary | ICD-10-CM | POA: Insufficient documentation

## 2016-04-15 DIAGNOSIS — N400 Enlarged prostate without lower urinary tract symptoms: Secondary | ICD-10-CM | POA: Diagnosis not present

## 2016-04-15 HISTORY — DX: Benign prostatic hyperplasia without lower urinary tract symptoms: N40.0

## 2016-04-15 LAB — CBC WITH DIFFERENTIAL/PLATELET
Basophils Absolute: 0 10*3/uL (ref 0–0.1)
Basophils Relative: 0 %
EOS PCT: 2 %
Eosinophils Absolute: 0.2 10*3/uL (ref 0–0.7)
HEMATOCRIT: 41.6 % (ref 40.0–52.0)
Hemoglobin: 14.3 g/dL (ref 13.0–18.0)
LYMPHS ABS: 2.9 10*3/uL (ref 1.0–3.6)
LYMPHS PCT: 36 %
MCH: 30.5 pg (ref 26.0–34.0)
MCHC: 34.4 g/dL (ref 32.0–36.0)
MCV: 88.7 fL (ref 80.0–100.0)
MONO ABS: 0.7 10*3/uL (ref 0.2–1.0)
Monocytes Relative: 9 %
NEUTROS ABS: 4.3 10*3/uL (ref 1.4–6.5)
Neutrophils Relative %: 53 %
PLATELETS: 183 10*3/uL (ref 150–440)
RBC: 4.69 MIL/uL (ref 4.40–5.90)
RDW: 14.4 % (ref 11.5–14.5)
WBC: 8.1 10*3/uL (ref 3.8–10.6)

## 2016-04-15 LAB — PROTIME-INR
INR: 1.06
Prothrombin Time: 13.8 seconds (ref 11.4–15.2)

## 2016-04-15 LAB — BASIC METABOLIC PANEL
Anion gap: 7 (ref 5–15)
BUN: 24 mg/dL — AB (ref 6–20)
CHLORIDE: 102 mmol/L (ref 101–111)
CO2: 28 mmol/L (ref 22–32)
Calcium: 9.7 mg/dL (ref 8.9–10.3)
Creatinine, Ser: 1.03 mg/dL (ref 0.61–1.24)
GFR calc Af Amer: >60 mL/min (ref >60)
GFR calc non Af Amer: >60 mL/min (ref >60)
GLUCOSE: 143 mg/dL — AB (ref 65–99)
POTASSIUM: 4.2 mmol/L (ref 3.5–5.1)
Sodium: 137 mmol/L (ref 135–145)

## 2016-04-15 LAB — APTT: aPTT: 29 seconds (ref 24–36)

## 2016-04-15 MED ORDER — CHLORHEXIDINE GLUCONATE CLOTH 2 % EX PADS
6.0000 | MEDICATED_PAD | Freq: Once | CUTANEOUS | Status: DC
Start: 2016-04-15 — End: 2016-04-16
  Filled 2016-04-15: qty 6

## 2016-04-15 NOTE — Patient Instructions (Signed)
  Your procedure is scheduled on:  Thurs Report to Same Day Surgery 2nd floor medical mall To find out your arrival time please call [phone] between 1PM - 3PM on 04/23/16 Wed  Remember: Instructions that are not followed completely may result in serious medical risk, up to and including death, or upon the discretion of your surgeon and anesthesiologist your surgery may need to be rescheduled.    _x___ 1. Do not eat food or drink liquids after midnight. No gum chewing or hard candies.     __x__ 2. No Alcohol for 24 hours before or after surgery.   __x__3. No Smoking for 24 prior to surgery.   ____  4. Bring all medications with you on the day of surgery if instructed.    __x__ 5. Notify your doctor if there is any change in your medical condition     (cold, fever, infections).     Do not wear jewelry, make-up, hairpins, clips or nail polish.  Do not wear lotions, powders, or perfumes. You may wear deodorant.  Do not shave 48 hours prior to surgery. Men may shave face and neck.  Do not bring valuables to the hospital.    [facility] is not responsible for any belongings or valuables.               Contacts, dentures or bridgework may not be worn into surgery.  Leave your suitcase in the car. After surgery it may be brought to your room.  For patients admitted to the hospital, discharge time is determined by your treatment team.   Patients discharged the day of surgery will not be allowed to drive home.    Please read over the following fact sheets that you were given:   [facility] Preparing for Surgery and or MRSA Information   _x___ Take these medicines the morning of surgery with A SIP OF WATER:    1. BREO ELLIPTA 100-25 MCG/INH AEPB  2.INCRUSE ELLIPTA 62.5 MCG/INH AEPB  3.metoprolol succinate (TOPROL-XL) 25 MG 24 hr tablet  4.pantoprazole (PROTONIX) 40 MG tablet  5.ranolazine (RANEXA) 500 MG 12 hr tablet  6.sotalol (BETAPACE) 80 MG tablet  ____Fleets enema or  Magnesium Citrate as directed.   _x___ Use CHG Soap or sage wipes as directed on instruction sheet   ____ Use inhalers on the day of surgery and bring to hospital day of surgery  ____ Stop metformin 2 days prior to surgery    __x__ Take 1/2 of usual insulin dose the night before surgery and none on the morning of surgery.   _x___ Stop aspirin or coumadin, or plavix  If OK with Dr Welton FlakesKhan stop Aspirin and Effient 1 week before surgery  x__ Stop Anti-inflammatories such as Advil, Aleve, Ibuprofen, Motrin, Naproxen,          Naprosyn, Goodies powders or aspirin products. Ok to take Tylenol.   ____ Stop supplements until after surgery.    ____ Bring C-Pap to the hospital.

## 2016-04-15 NOTE — Pre-Procedure Instructions (Signed)
Cardiac clearance on chart. 

## 2016-04-21 NOTE — OR Nursing (Signed)
CLEARED BY DR Milta DeitersS KHAN 03/04/16. CALLED AND FAXED EKG FOR REVIEW. LM ON NURSES LINE TO HAVE EKG REVIEWED

## 2016-04-23 MED ORDER — CEFAZOLIN SODIUM-DEXTROSE 2-4 GM/100ML-% IV SOLN
2.0000 g | INTRAVENOUS | Status: AC
  Administered 2016-04-24: 2 g via INTRAVENOUS

## 2016-04-23 NOTE — Pre-Procedure Instructions (Signed)
CLEARED BY DR Welton FlakesKHAN AFTER REVIEW OF PREOP EKG

## 2016-04-24 ENCOUNTER — Encounter
Admission: RE | Disposition: A | Discharge status: Home / Self Care | Payer: Self-pay | Source: Ambulatory Visit | Attending: Orthopedic Surgery

## 2016-04-24 ENCOUNTER — Ambulatory Visit
Admission: RE | Admit: 2016-04-24 | Discharge: 2016-04-24 | Disposition: A | Discharge status: Home / Self Care | Payer: Medicare Other | Source: Ambulatory Visit | Attending: Orthopedic Surgery | Admitting: Orthopedic Surgery

## 2016-04-24 ENCOUNTER — Encounter: Payer: Self-pay | Admitting: *Deleted

## 2016-04-24 ENCOUNTER — Ambulatory Visit: Payer: Medicare Other | Admitting: Anesthesiology

## 2016-04-24 DIAGNOSIS — M23321 Other meniscus derangements, posterior horn of medial meniscus, right knee: Secondary | ICD-10-CM | POA: Insufficient documentation

## 2016-04-24 DIAGNOSIS — J449 Chronic obstructive pulmonary disease, unspecified: Secondary | ICD-10-CM | POA: Diagnosis not present

## 2016-04-24 DIAGNOSIS — E119 Type 2 diabetes mellitus without complications: Secondary | ICD-10-CM | POA: Insufficient documentation

## 2016-04-24 DIAGNOSIS — M23351 Other meniscus derangements, posterior horn of lateral meniscus, right knee: Secondary | ICD-10-CM | POA: Diagnosis not present

## 2016-04-24 DIAGNOSIS — I2510 Atherosclerotic heart disease of native coronary artery without angina pectoris: Secondary | ICD-10-CM | POA: Diagnosis not present

## 2016-04-24 DIAGNOSIS — Z794 Long term (current) use of insulin: Secondary | ICD-10-CM | POA: Insufficient documentation

## 2016-04-24 DIAGNOSIS — Z79899 Other long term (current) drug therapy: Secondary | ICD-10-CM | POA: Insufficient documentation

## 2016-04-24 DIAGNOSIS — E785 Hyperlipidemia, unspecified: Secondary | ICD-10-CM | POA: Insufficient documentation

## 2016-04-24 DIAGNOSIS — K219 Gastro-esophageal reflux disease without esophagitis: Secondary | ICD-10-CM | POA: Diagnosis not present

## 2016-04-24 DIAGNOSIS — S83241A Other tear of medial meniscus, current injury, right knee, initial encounter: Secondary | ICD-10-CM | POA: Diagnosis present

## 2016-04-24 HISTORY — PX: KNEE ARTHROSCOPY: SHX127

## 2016-04-24 LAB — GLUCOSE, CAPILLARY
Glucose-Capillary: 230 mg/dL — ABNORMAL HIGH (ref 65–99)
Glucose-Capillary: 232 mg/dL — ABNORMAL HIGH (ref 65–99)

## 2016-04-24 SURGERY — ARTHROSCOPY, KNEE
Anesthesia: General | Laterality: Right

## 2016-04-24 MED ORDER — CEFAZOLIN SODIUM-DEXTROSE 2-4 GM/100ML-% IV SOLN
INTRAVENOUS | Status: AC
  Filled 2016-04-24: qty 100

## 2016-04-24 MED ORDER — OXYCODONE HCL 5 MG/5ML PO SOLN: 5.0000 mg | Freq: Once | ORAL | Status: DC | PRN

## 2016-04-24 MED ORDER — ONDANSETRON HCL 4 MG PO TABS: 4.0000 mg | ORAL_TABLET | Freq: Three times a day (TID) | ORAL | 0 refills | Status: AC | PRN

## 2016-04-24 MED ORDER — MIDAZOLAM HCL 2 MG/2ML IJ SOLN
INTRAMUSCULAR | Status: DC | PRN
  Administered 2016-04-24: 2 mg via INTRAVENOUS

## 2016-04-24 MED ORDER — ROCURONIUM BROMIDE 100 MG/10ML IV SOLN
INTRAVENOUS | Status: DC | PRN
  Administered 2016-04-24: 20 mg via INTRAVENOUS

## 2016-04-24 MED ORDER — OXYCODONE HCL 5 MG PO TABS: 5.0000 mg | ORAL_TABLET | Freq: Once | ORAL | Status: DC | PRN

## 2016-04-24 MED ORDER — PROMETHAZINE HCL 25 MG/ML IJ SOLN: 6.2500 mg | INTRAMUSCULAR | Status: DC | PRN

## 2016-04-24 MED ORDER — PROPOFOL 10 MG/ML IV BOLUS
INTRAVENOUS | Status: DC | PRN
  Administered 2016-04-24: 150 mg via INTRAVENOUS

## 2016-04-24 MED ORDER — PHENYLEPHRINE HCL 10 MG/ML IJ SOLN
INTRAMUSCULAR | Status: DC | PRN
  Administered 2016-04-24 (×4): 100 ug via INTRAVENOUS
  Administered 2016-04-24: 200 ug via INTRAVENOUS

## 2016-04-24 MED ORDER — FENTANYL CITRATE (PF) 100 MCG/2ML IJ SOLN
INTRAMUSCULAR | Status: DC | PRN
  Administered 2016-04-24: 50 ug via INTRAVENOUS

## 2016-04-24 MED ORDER — SUCCINYLCHOLINE CHLORIDE 20 MG/ML IJ SOLN
INTRAMUSCULAR | Status: DC | PRN
  Administered 2016-04-24: 140 mg via INTRAVENOUS

## 2016-04-24 MED ORDER — LIDOCAINE HCL 1 % IJ SOLN
INTRAMUSCULAR | Status: DC | PRN
  Administered 2016-04-24: 10 mL

## 2016-04-24 MED ORDER — ASPIRIN EC 325 MG PO TBEC: 325.0000 mg | DELAYED_RELEASE_TABLET | Freq: Every day | ORAL | 0 refills | Status: AC

## 2016-04-24 MED ORDER — EPHEDRINE SULFATE 50 MG/ML IJ SOLN
INTRAMUSCULAR | Status: DC | PRN
  Administered 2016-04-24: 5 mg via INTRAVENOUS
  Administered 2016-04-24: 10 mg via INTRAVENOUS

## 2016-04-24 MED ORDER — SUGAMMADEX SODIUM 200 MG/2ML IV SOLN
INTRAVENOUS | Status: DC | PRN
  Administered 2016-04-24: 200 mg via INTRAVENOUS

## 2016-04-24 MED ORDER — FENTANYL CITRATE (PF) 100 MCG/2ML IJ SOLN: 25.0000 ug | INTRAMUSCULAR | Status: DC | PRN

## 2016-04-24 MED ORDER — ONDANSETRON HCL 4 MG/2ML IJ SOLN
INTRAMUSCULAR | Status: DC | PRN
  Administered 2016-04-24: 4 mg via INTRAVENOUS

## 2016-04-24 MED ORDER — LIDOCAINE HCL (PF) 1 % IJ SOLN
INTRAMUSCULAR | Status: AC
  Filled 2016-04-24: qty 30

## 2016-04-24 MED ORDER — BUPIVACAINE-EPINEPHRINE (PF) 0.25% -1:200000 IJ SOLN
INTRAMUSCULAR | Status: AC
  Filled 2016-04-24: qty 30

## 2016-04-24 MED ORDER — HYDROCODONE-ACETAMINOPHEN 5-325 MG PO TABS: 1.0000 | ORAL_TABLET | Freq: Four times a day (QID) | ORAL | 0 refills | Status: AC | PRN

## 2016-04-24 MED ORDER — SODIUM CHLORIDE 0.9 % IV SOLN
INTRAVENOUS | Status: DC
  Administered 2016-04-24 (×2): via INTRAVENOUS

## 2016-04-24 MED ORDER — LIDOCAINE HCL (CARDIAC) 20 MG/ML IV SOLN
INTRAVENOUS | Status: DC | PRN
  Administered 2016-04-24: 100 mg via INTRAVENOUS

## 2016-04-24 MED ORDER — VASOPRESSIN 20 UNIT/ML IV SOLN
INTRAVENOUS | Status: DC | PRN
Start: 2016-04-24 — End: 2016-04-24
  Administered 2016-04-24 (×4): 2 [IU] via INTRAVENOUS
  Administered 2016-04-24: 4 [IU] via INTRAVENOUS
  Administered 2016-04-24 (×2): 2 [IU] via INTRAVENOUS

## 2016-04-24 MED ORDER — MEPERIDINE HCL 25 MG/ML IJ SOLN: 6.2500 mg | INTRAMUSCULAR | Status: DC | PRN

## 2016-04-24 SURGICAL SUPPLY — 35 items
BUR RADIUS 3.5 (BURR) ×3 IMPLANT
BUR RADIUS 4.0X18.5 (BURR) ×3 IMPLANT
CLOSURE WOUND 1/2 X4 (GAUZE/BANDAGES/DRESSINGS) ×1
COOLER POLAR GLACIER W/PUMP (MISCELLANEOUS) IMPLANT
CUFF TOURN 24 STER (MISCELLANEOUS) ×3 IMPLANT
CUFF TOURN 30 STER DUAL PORT (MISCELLANEOUS) IMPLANT
DRAPE IMP U-DRAPE 54X76 (DRAPES) ×3 IMPLANT
DURAPREP 26ML APPLICATOR (WOUND CARE) ×9 IMPLANT
GAUZE PETRO XEROFOAM 1X8 (MISCELLANEOUS) ×3 IMPLANT
GAUZE SPONGE 4X4 12PLY STRL (GAUZE/BANDAGES/DRESSINGS) ×3 IMPLANT
GLOVE BIOGEL PI IND STRL 9 (GLOVE) ×1 IMPLANT
GLOVE BIOGEL PI INDICATOR 9 (GLOVE) ×2
GLOVE SURG 9.0 ORTHO LTXF (GLOVE) ×6 IMPLANT
GOWN STRL REUS TWL 2XL XL LVL4 (GOWN DISPOSABLE) ×3 IMPLANT
GOWN STRL REUS W/ TWL LRG LVL3 (GOWN DISPOSABLE) ×1 IMPLANT
GOWN STRL REUS W/TWL LRG LVL3 (GOWN DISPOSABLE) ×2
IV LACTATED RINGER IRRG 3000ML (IV SOLUTION) ×8
IV LR IRRIG 3000ML ARTHROMATIC (IV SOLUTION) ×4 IMPLANT
KIT RM TURNOVER STRD PROC AR (KITS) ×3 IMPLANT
MANIFOLD NEPTUNE II (INSTRUMENTS) ×3 IMPLANT
PACK ARTHROSCOPY KNEE (MISCELLANEOUS) ×3 IMPLANT
PAD ABD DERMACEA PRESS 5X9 (GAUZE/BANDAGES/DRESSINGS) ×6 IMPLANT
PAD WRAPON POLAR KNEE (MISCELLANEOUS) ×1 IMPLANT
SET TUBE SUCT SHAVER OUTFL 24K (TUBING) ×3 IMPLANT
SET TUBE TIP INTRA-ARTICULAR (MISCELLANEOUS) ×3 IMPLANT
SOL PREP PVP 2OZ (MISCELLANEOUS)
SOLUTION PREP PVP 2OZ (MISCELLANEOUS) IMPLANT
STRIP CLOSURE SKIN 1/2X4 (GAUZE/BANDAGES/DRESSINGS) ×2 IMPLANT
SUT ETHILON 4-0 (SUTURE) ×2
SUT ETHILON 4-0 FS2 18XMFL BLK (SUTURE) ×1
SUTURE ETHLN 4-0 FS2 18XMF BLK (SUTURE) ×1 IMPLANT
TUBING ARTHRO INFLOW-ONLY STRL (TUBING) ×3 IMPLANT
WAND HAND CNTRL MULTIVAC 50 (MISCELLANEOUS) IMPLANT
WAND HAND CNTRL MULTIVAC 90 (MISCELLANEOUS) ×3 IMPLANT
WRAPON POLAR PAD KNEE (MISCELLANEOUS) ×3

## 2016-04-24 NOTE — Anesthesia Procedure Notes (Signed)
Procedure Name: Intubation Date/Time:  7:47 AM Performed by: Irving BurtonBACHICH, Andreika Vandagriff Pre-anesthesia Checklist: Patient identified, Emergency Drugs available, Suction available and Patient being monitored Patient Re-evaluated:Patient Re-evaluated prior to inductionOxygen Delivery Method: Circle system utilized Preoxygenation: Pre-oxygenation with 100% oxygen Intubation Type: IV induction Ventilation: Oral airway inserted - appropriate to patient size and Two handed mask ventilation required Laryngoscope Size: Mac and 4 Grade View: Grade I Tube type: Oral Tube size: 7.5 mm Number of attempts: 1 Airway Equipment and Method: Stylet Placement Confirmation: ETT inserted through vocal cords under direct vision,  positive ETCO2 and breath sounds checked- equal and bilateral Secured at: 22 cm Tube secured with: Tape Dental Injury: Injury to lip

## 2016-04-24 NOTE — Discharge Instructions (Signed)
Crutch Use, Adult Crutches are used to take weight off of one of your legs or feet when you stand or walk. You may need crutches to help heal after an injury or procedure. It is important to use crutches that fit right. Your crutches fit right if:  You can fit 2 or 3 fingers between your armpit and the crutch.  You use your hands, not your armpits, to hold yourself up. Do not put your armpits on the crutches. This can damage the nerves in your shoulders, arms, back, armpits, and hands. It is important that a doctor has seen you use crutches the right way before you use them at home. How to use your crutches How you will use your crutches will depend on why you need them. Your doctor may tell you not to put weight on (not to support your weight with) your hurt leg (non-weight-bearing). Or, your doctor may let you put (bear) some of your weight on the hurt leg (partial weight-bearing), but not all of your weight. Follow instructions from your doctor about weight-bearing. Do not put weight on your leg in an amount that causes pain. Walking 1. Stand on your good leg and lift both crutches at the same time. 2. Place the crutches one step-length in front of you. 3. Bring the good leg forward to meet the crutches or to land a little bit ahead of them. 4. Repeat. Going up steps  If there is no handrail: 1. Step up with your good leg. 2. Step up with the crutches and your hurt leg. 3. Repeat. If there is a handrail: 1. Hold both crutches in one hand. 2. Place your other hand on the handrail. 3. Put your weight on your arms and lift your good leg up to the step. 4. Bring the crutches and the hurt leg up to that step. 5. Repeat. Going up steps on your butt  If you do not feel steady on steps, you can go up steps on your butt. 1. Sit on the lowest step.  Have your hurt leg out in front.  Use your other hand to hold both crutches flat on the stairs. 2. Scoot your butt up to the next step. Use the  free hand and your good leg to help you do this. Going down steps  If there is no handrail: 1. Step down with your hurt leg and crutches. 2. Step down with your good leg. 3. Repeat. If there is a handrail: 1. Place your hand on the handrail. 2. Hold both crutches with your free hand. 3. Lower your hurt leg and crutch to the step below you. Keep the crutch tips in the center of the step. Never put the crutch tips on the edge of the step. 4. Lower your good leg to that step. 5. Repeat. Going down steps on your butt  If you do not feel steady on steps, you can go down steps on your butt. 1. Sit on the highest step.  Have your hurt leg out in front.  Use your other hand to hold both crutches flat on the stairs. 2. Scoot your butt down to the next step. Use the free hand and your good leg to help you do this. Standing up 1. Hold the hurt leg forward. 2. Grab the armrest with one hand. Use the other hand to grab the top of the crutches. 3. Use the armrest and your crutches to pull yourself up to stand. Sitting down 1. Hold the  hurt leg forward. 2. Grab the armrest with one hand. Use the other hand to grab the top of the crutches. 3. Slowly lower yourself to sit. Get help if:  You feel unsteady or wobbly using crutches.  You have any new pain.  You cannot feel a part of your body (numbness) or you have a tingling feeling.  Your crutches do not fit. Get help right away if:  You fall. This information is not intended to replace advice given to you by your health care provider. Make sure you discuss any questions you have with your health care provider. Document Released: 10/29/2007 Document Revised: 12/14/2015 Document Reviewed: 11/02/2015 Elsevier Interactive Patient Education  2017 [employer] AMBULATORY SURGERY  DISCHARGE INSTRUCTIONS   1) The drugs that you were given will stay in your system until tomorrow so for the next 24 hours you should not:  A) Drive an  automobile B) Make any legal decisions C) Drink any alcoholic beverage   2) You may resume regular meals tomorrow.  Today it is better to start with liquids and gradually work up to solid foods.  You may eat anything you prefer, but it is better to start with liquids, then soup and crackers, and gradually work up to solid foods.   3) Please notify your doctor immediately if you have any unusual bleeding, trouble breathing, redness and pain at the surgery site, drainage, fever, or pain not relieved by medication.    4) Additional Instructions:        Please contact your physician with any problems or Same Day Surgery at [phone], Monday through Friday 6 am to 4 pm, or [place] [facility] Main number at [phone].

## 2016-04-24 NOTE — Op Note (Signed)
  PATIENT:  [name]  PRE-OPERATIVE DIAGNOSIS:  TEAR OF RIGHT MEDIAL MENISCUS  POST-OPERATIVE DIAGNOSIS:  TEAR OF RIGHT MEDIAL AND LATERAL MENISCUS  AND SYNOVITIS  PROCEDURE: RIGHT KNEE ARTHROSCOPY WITH  Partial MEDIAL and LATERAL MENISECTOMY, limited synovectomy  SURGEON:  Thornton Park, MD  ANESTHESIA:   General  PREOPERATIVE INDICATIONS:  [name]  70 y.o. male with a diagnosis of TEAR OF MEDIAL MENISCUS who failed conservative management and elected for surgical management.    The risks benefits and alternatives were discussed with the patient preoperatively including the risks of infection, bleeding, nerve injury, knee stiffness, persistent pain, osteoarthritis and the need for further surgery. Medical  risks include DVT and pulmonary embolism, myocardial infarction, stroke, pneumonia, respiratory failure and death. The patient understood these risks and wished to proceed.   OPERATIVE FINDINGS: Complex tear of the posterior horn meniscus, degenerative tear of the inner margin of the lateral meniscus  OPERATIVE PROCEDURE: Patient was met in the preoperative area. The operative extremity was signed with the word yes and my initials according the hospital's correct site of surgery protocol.  The patient was brought to the operating room where they was placed supine on the operative table. General anesthesia was administered. The patient was prepped and draped in a sterile fashion.  A timeout was performed to verify the patient's name, date of birth, medical record number, correct site of surgery correct procedure to be performed. It was also used to verify the patient received antibiotics that all appropriate instruments, and radiographic studies were available in the room. Once all in attendance were in agreement, the case began.  Proposed arthroscopy incisions were drawn out with a surgical marker. These were pre-injected with 1% lidocaine plain. An 11 blade was used to establish  an inferior lateral and inferomedial portals. The inferomedial portal was created using a 18-gauge spinal needle under direct visualization.  A full diagnostic examination of the knee was performed including the suprapatellar pouch, patellofemoral joint, medial lateral compartments as well as the medial lateral gutters, the intercondylar notch in the posterior knee.    A partial synovectomy was performed using a 4-0 resector shaver blade and 90 ArthroCare wand to debride the synovitis in the anterior knee. This allowed for better visualization during surgery..  Patient was found to have a large complex tear of the medial meniscus which was probed and found to be unstable. Patient had the meniscal tear treated with a 4-0 resector shaver blade and straight duckbill biter. The medial meniscus was debrided until a stable rim was achieved.  No focal chondral lesions seen in any of the 3 compartments of the knee.  The lateral meniscus had a degenerative tear involving the inner margin of the body and posterior horn. This was debrided with a 4.50m resector shaver blade.   The knee was then copiously lavaged. All arthroscopic instruments were removed. The 2 arthroscopy portals were closed with 4-0 nylon. Steri-Strips were applied along with a dry sterile and compressive dressing. The patient was brought to the PACU in stable condition. I was scrubbed and present for the entire case and all sharp and instrument counts were correct at the conclusion the case. I spoke with the patient's wife postoperatively to let her know the case was performed without complication and the patient was stable in the recovery room.  KTimoteo Gaul MD

## 2016-04-24 NOTE — Transfer of Care (Signed)
Immediate Anesthesia Transfer of Care Note  Patient: Thomas BucklerRodney Werner  Procedure(s) Performed: Procedure(s) with comments: ARTHROSCOPY KNEE (Right) - KNEE ARTHROSCOPY WITH PARTIAL MEDIAL and LATERAL MENISCECTOMY, Limited synovectomy  Patient Location: PACU  Anesthesia Type:General  Level of Consciousness: sedated  Airway & Oxygen Therapy: Patient connected to face mask oxygen  Post-op Assessment: Post -op Vital signs reviewed and stable  Post vital signs: stable  Last Vitals:  Vitals:    0611  0927  BP: (!) 110/58 118/65  Pulse: 77 71  Resp: 14 13  Temp: 36.6 C (!) 35.9 C    Last Pain:  Vitals:    0927  TempSrc: Temporal         Complications: No apparent anesthesia complications

## 2016-04-24 NOTE — Anesthesia Preprocedure Evaluation (Addendum)
Anesthesia Evaluation  Patient identified by MRN, date of birth, ID band Patient awake    Reviewed: Allergy & Precautions, NPO status , Patient's Chart, lab work & pertinent test results, reviewed documented beta blocker date and time   History of Anesthesia Complications Negative for: history of anesthetic complications  Airway Mallampati: II  TM Distance: >3 FB Neck ROM: Full    Dental  (+) Poor Dentition   Pulmonary asthma , sleep apnea and Continuous Positive Airway Pressure Ventilation , COPD,  COPD inhaler,    breath sounds clear to auscultation- rhonchi (-) wheezing      Cardiovascular Exercise Tolerance: Good hypertension, Pt. on medications and Pt. on home beta blockers (-) angina+ CAD, + Cardiac Stents and + CABG   Rhythm:Regular Rate:Normal - Systolic murmurs and - Diastolic murmurs L heart cath 1/61/095/16/16:  Prox RCA lesion, 70% stenosed.  Dist RCA lesion, 50% stenosed.  Ost Cx to Prox Cx lesion, 100% stenosed.  Ost LAD lesion, 100% stenosed.  LIMA to LAD and SVG to D1 and OM1 were patent, but SVG to PDA is occluded. RCA has moderate disease proximally which is severely calcified. Advise medical treatment initially and if it fails will do PCI. LVEF 50 %.   Neuro/Psych negative psych ROS   GI/Hepatic Neg liver ROS, GERD  ,  Endo/Other  diabetes, Insulin Dependent  Renal/GU negative Renal ROS     Musculoskeletal   Abdominal (+) + obese,   Peds  Hematology negative hematology ROS (+)   Anesthesia Other Findings Past Medical History: No date: Anginal pain (HCC) No date: Asthma No date: COPD (chronic obstructive pulmonary disease) (*     Comment: Early stages No date: Coronary artery disease No date: Diabetes mellitus without complication (HCC) No date: Enlarged prostate No date: GERD (gastroesophageal reflux disease) No date: Hyperlipidemia No date: Hypertension No date: Sleep apnea   Reproductive/Obstetrics                            Anesthesia Physical Anesthesia Plan  ASA: III  Anesthesia Plan: General   Post-op Pain Management:    Induction: Intravenous  Airway Management Planned: Oral ETT  Additional Equipment:   Intra-op Plan:   Post-operative Plan: Extubation in OR  Informed Consent: I have reviewed the patients History and Physical, chart, labs and discussed the procedure including the risks, benefits and alternatives for the proposed anesthesia with the patient or authorized representative who has indicated his/her understanding and acceptance.   Dental advisory given  Plan Discussed with: CRNA and Anesthesiologist  Anesthesia Plan Comments:        Anesthesia Quick Evaluation

## 2016-04-24 NOTE — H&P (Signed)
The patient has been re-examined, and the chart reviewed, and there have been no interval changes to the documented history and physical.    The risks, benefits, and alternatives have been discussed at length, and the patient is willing to proceed.   

## 2016-04-24 NOTE — Anesthesia Postprocedure Evaluation (Signed)
Anesthesia Post Note  Patient: [name]  Procedure(s) Performed: Procedure(s) (LRB): ARTHROSCOPY KNEE (Right)  Patient location during evaluation: PACU Anesthesia Type: General Level of consciousness: awake and alert Pain management: pain level controlled Vital Signs Assessment: post-procedure vital signs reviewed and stable Respiratory status: spontaneous breathing, nonlabored ventilation and respiratory function stable Cardiovascular status: blood pressure returned to baseline and stable Postop Assessment: no signs of nausea or vomiting Anesthetic complications: no    Last Vitals:  Vitals:    0941  0956  BP: 114/69 125/67  Pulse: 73 76  Resp: 16 17  Temp: 36.3 C     Last Pain:  Vitals:    0927  TempSrc: Temporal                 Roxie Gueye

## 2016-04-25 DEATH — deceased

## 2016-04-29 IMAGING — MR MR HEAD WO/W CM
8 of 11 series · 35 of 48 positions shown · IV contrast (20 ML MULTIHANCE)
Comparison: 04/15/2007 MR.

CLINICAL DATA: 69-year-old hypertensive diabetic male with
hyperlipidemia presenting with dizzy spells for 4 months increasing
in frequency and intensity until 3 weeks ago. Response to prednisone
treatment. Initial encounter.

EXAM:
MRI HEAD WITHOUT AND WITH CONTRAST
TECHNIQUE: Multiplanar, multiecho pulse sequences of the brain and surrounding
structures were obtained without and with intravenous contrast.
CONTRAST:  20mL MULTIHANCE GADOBENATE DIMEGLUMINE 529 MG/ML IV SOLN

[Series 2: T1 · sagittal · 5.0mm · 0.45mm/px · 3 of 25 slices shown (1 of 3)]
[im 1/25]
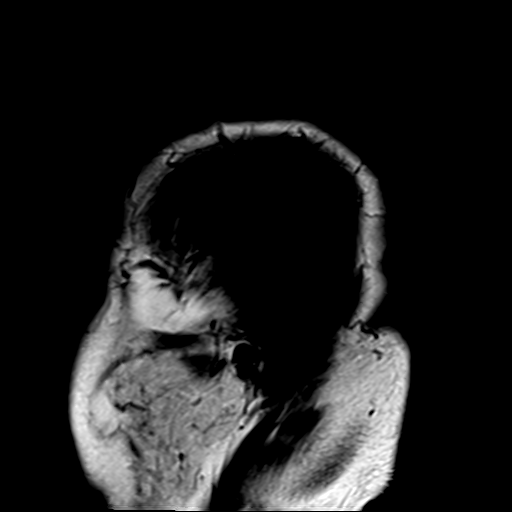
[im 13/25]
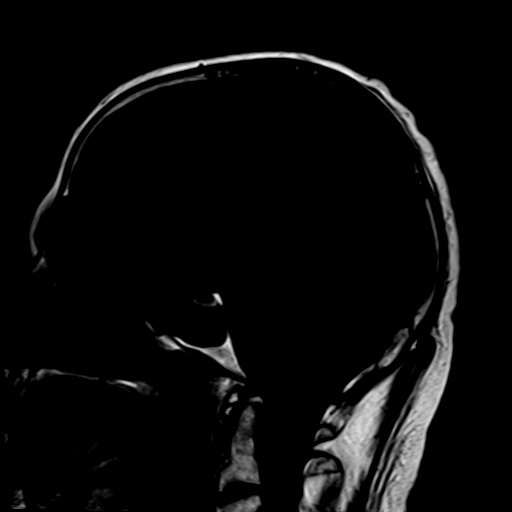
[im 25/25]
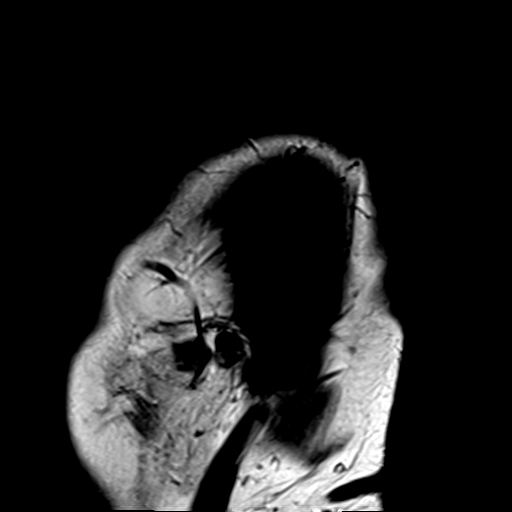

[Series 5: T2 · axial · 5.0mm · 0.60mm/px · z∈[-50,+106]mm · 4 of 25 slices shown (1 of 2)]
[im 1/25]
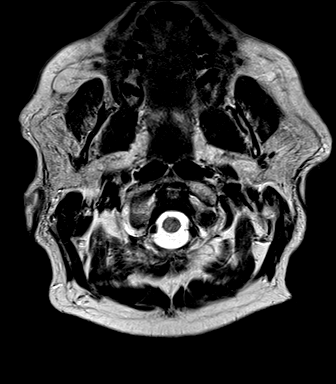
[im 9/25]
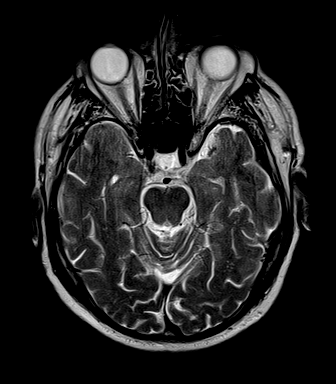
[im 17/25]
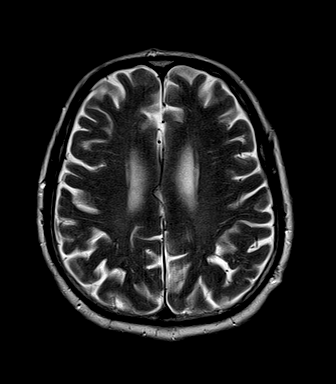
[im 25/25]
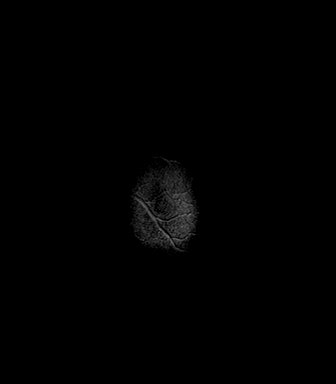

[Series 6: FLAIR · axial · 5.0mm · 0.45mm/px · z∈[-50,+106]mm · 4 of 25 slices shown]
[im 1/25]
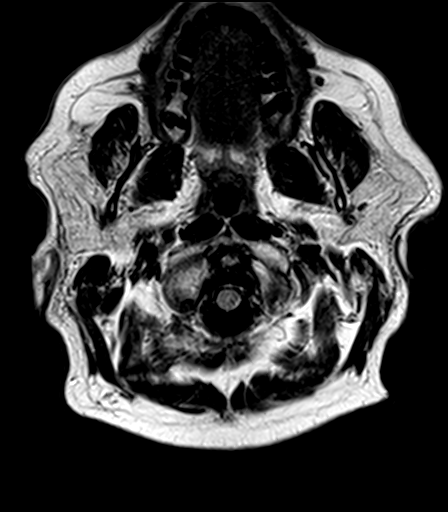
[im 9/25]
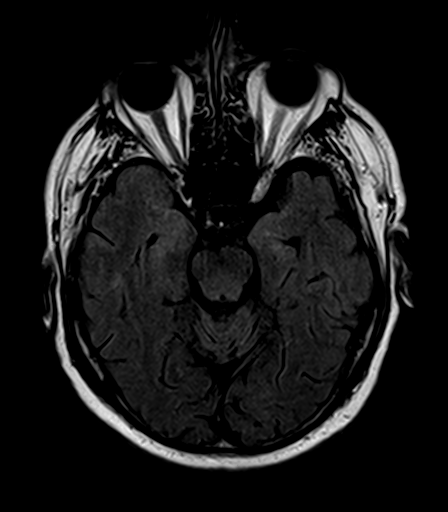
[im 17/25]
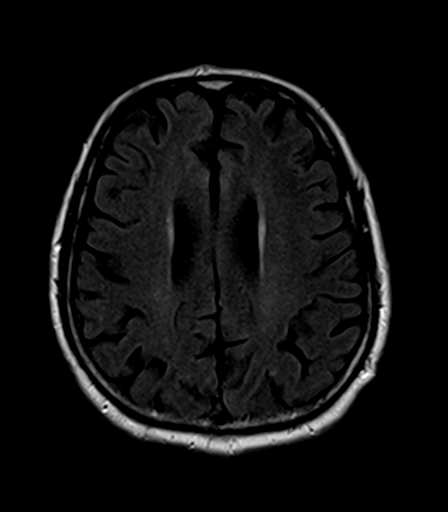
[im 25/25]
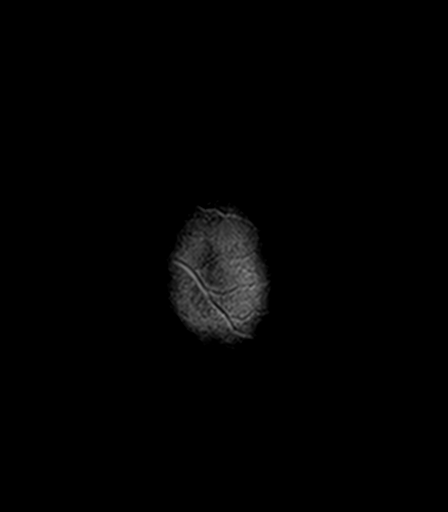

[Series 7: T1 · coronal · 3.0mm · 0.78mm/px · 2 of 11 slices shown (2 of 3)]
[im 1/11]
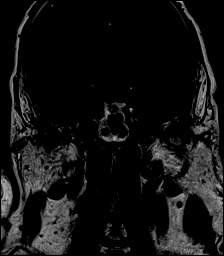
[im 11/11]
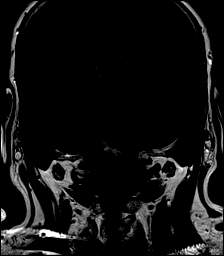

[Series 8: T2 · axial · 1.0mm · 0.35mm/px · z∈[-35,+4]mm · 7 of 40 slices shown (2 of 2)]
[im 1/40]
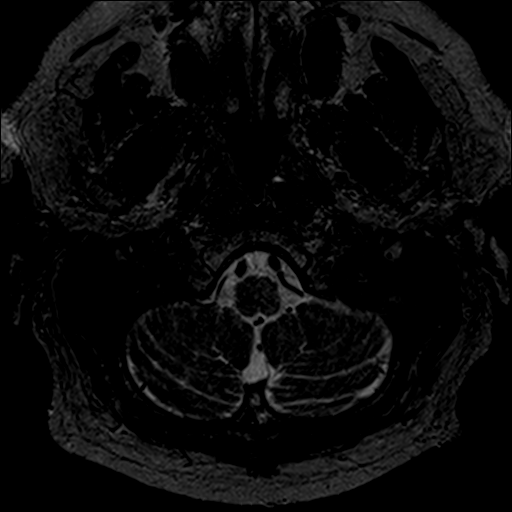
[im 7/40]
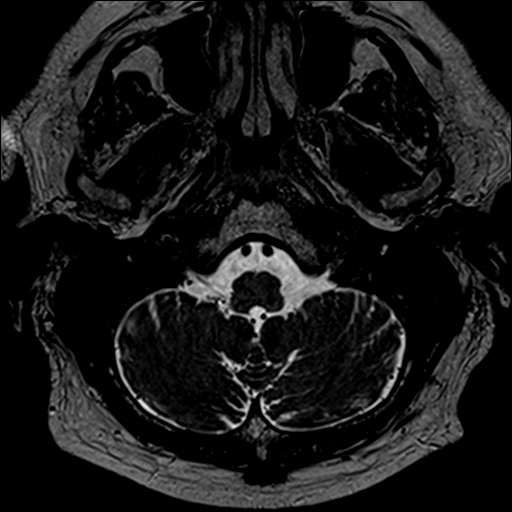
[im 14/40]
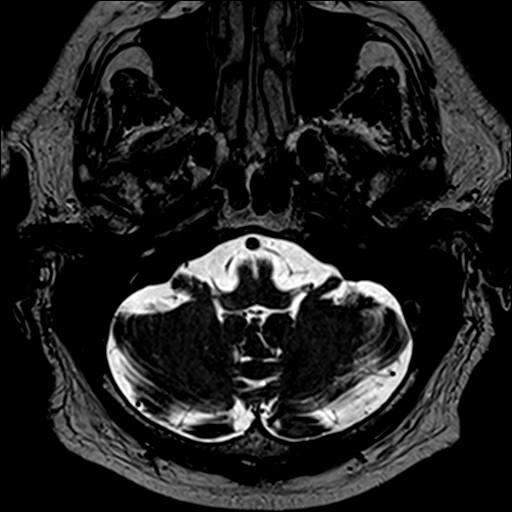
[im 20/40]
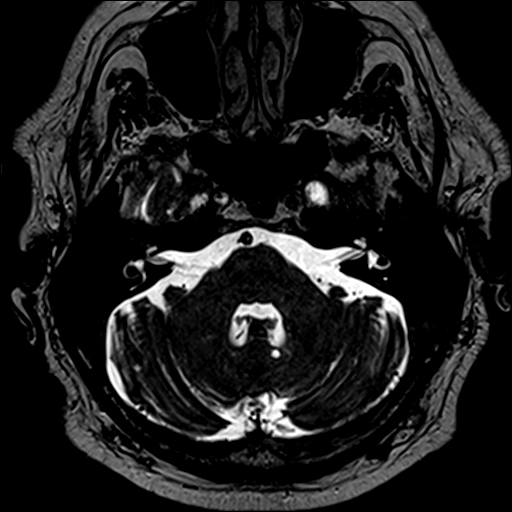
[im 27/40]
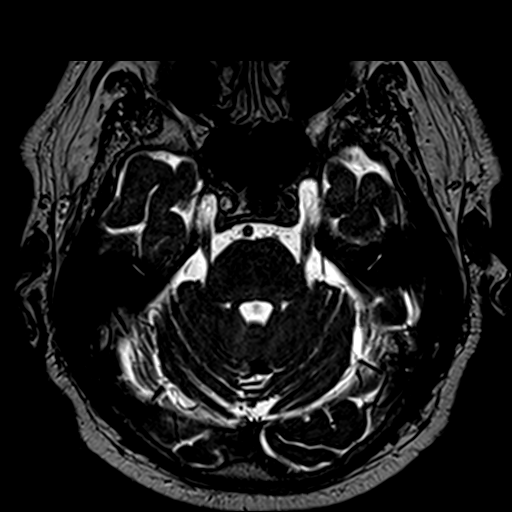
[im 33/40]
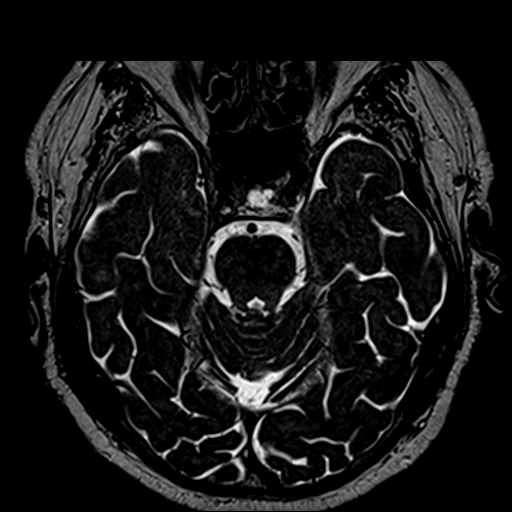
[im 40/40]
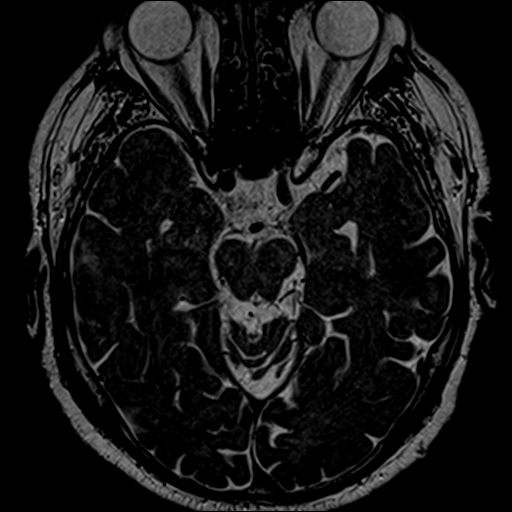

[Series 9: T1 · axial · 3.0mm · 0.39mm/px · z∈[-32,+1]mm · 2 of 11 slices shown (3 of 3)]
[im 1/11]
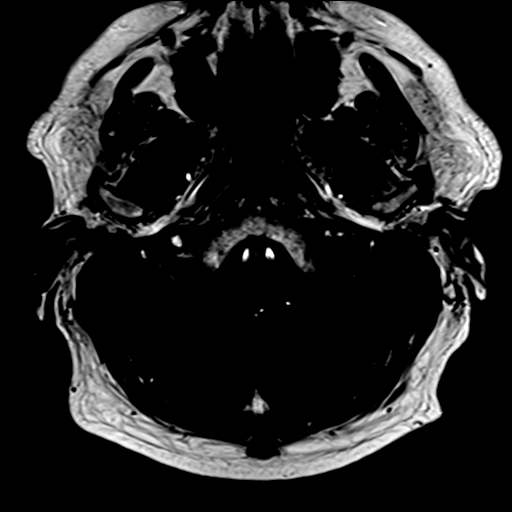
[im 11/11]
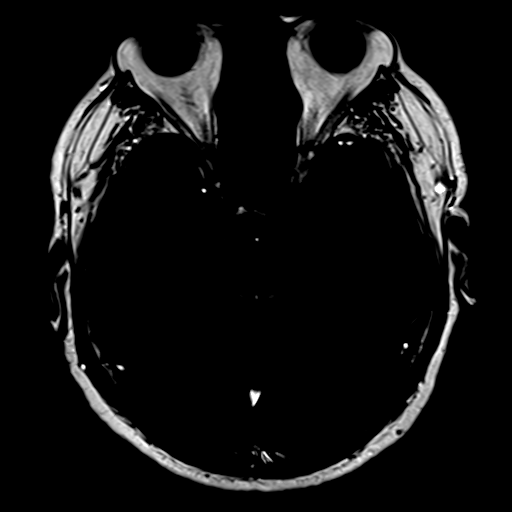

[Series 100: DWI · axial · 3.0mm · 1.80mm/px · z∈[-52,+108]mm · 7 of 42 slices shown]
[im 1/42]
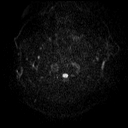
[im 7/42]
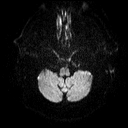
[im 14/42]
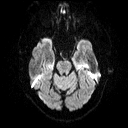
[im 21/42]
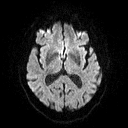
[im 28/42]
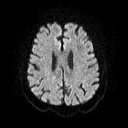
[im 35/42]
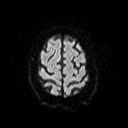
[im 42/42]
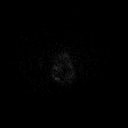

[Series 101: ADC · axial · 3.0mm · 1.80mm/px · z∈[-52,+108]mm · 6 of 38 slices shown]
[im 1/38]
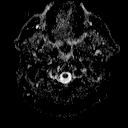
[im 8/38]
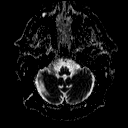
[im 15/38]
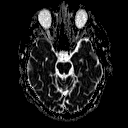
[im 23/38]
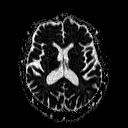
[im 30/38]
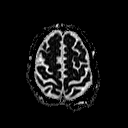
[im 38/38]
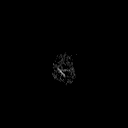

[35 of 48 positions shown; findings below may reference images not displayed]

FINDINGS: No acute infarct or intracranial hemorrhage.

Labyrinthine structures are symmetric and normal bilaterally. No
internal auditory canal enhancing lesion.

Slight asymmetry of the Gasserian ganglion without evidence of
abnormal enhancement of the fifth cranial nerve. Appearance
unchanged.

Mild global atrophy without hydrocephalus.

Very mild chronic microvascular changes.

Major intracranial vascular structures are patent.

No intracranial mass or abnormal enhancement.

Partially empty non expanded sella without change.

Post lens replacement otherwise orbital structures unremarkable.

Cervical medullary junction within normal limits.
IMPRESSION: No acute infarct or intracranial hemorrhage.

No intracranial mass or abnormal enhancement.

## 2016-10-28 ENCOUNTER — Ambulatory Visit (INDEPENDENT_AMBULATORY_CARE_PROVIDER_SITE_OTHER): Payer: Self-pay | Admitting: Vascular Surgery

## 2016-10-28 ENCOUNTER — Encounter (INDEPENDENT_AMBULATORY_CARE_PROVIDER_SITE_OTHER): Payer: Self-pay
